# Patient Record
Sex: Female | Born: 1935 | Race: White | Hispanic: No | Smoking: Never smoker
Health system: Southern US, Community
[De-identification: ages and names within clinical notes are randomized; demographics above are authoritative.]

## PROBLEM LIST (undated history)

## (undated) DIAGNOSIS — F32A Depression, unspecified: Secondary | ICD-10-CM

## (undated) DIAGNOSIS — I82409 Acute embolism and thrombosis of unspecified deep veins of unspecified lower extremity: Secondary | ICD-10-CM

## (undated) DIAGNOSIS — I10 Essential (primary) hypertension: Secondary | ICD-10-CM

## (undated) DIAGNOSIS — I519 Heart disease, unspecified: Secondary | ICD-10-CM

## (undated) DIAGNOSIS — E785 Hyperlipidemia, unspecified: Secondary | ICD-10-CM

## (undated) DIAGNOSIS — M1990 Unspecified osteoarthritis, unspecified site: Secondary | ICD-10-CM

## (undated) DIAGNOSIS — G4700 Insomnia, unspecified: Secondary | ICD-10-CM

## (undated) DIAGNOSIS — K219 Gastro-esophageal reflux disease without esophagitis: Secondary | ICD-10-CM

## (undated) DIAGNOSIS — M48061 Spinal stenosis, lumbar region without neurogenic claudication: Secondary | ICD-10-CM

## (undated) DIAGNOSIS — E079 Disorder of thyroid, unspecified: Secondary | ICD-10-CM

## (undated) DIAGNOSIS — K52832 Lymphocytic colitis: Secondary | ICD-10-CM

## (undated) HISTORY — DX: Gastro-esophageal reflux disease without esophagitis: K21.9

## (undated) HISTORY — DX: Acute embolism and thrombosis of unspecified deep veins of unspecified lower extremity: I82.409

## (undated) HISTORY — PX: SPINE SURGERY: SHX786

## (undated) HISTORY — DX: Disorder of thyroid, unspecified: E07.9

## (undated) HISTORY — DX: Insomnia, unspecified: G47.00

## (undated) HISTORY — PX: CHOLECYSTECTOMY: SHX55

## (undated) HISTORY — DX: Hyperlipidemia, unspecified: E78.5

## (undated) HISTORY — DX: Depression, unspecified: F32.A

## (undated) HISTORY — DX: Heart disease, unspecified: I51.9

## (undated) HISTORY — DX: Unspecified osteoarthritis, unspecified site: M19.90

## (undated) HISTORY — DX: Lymphocytic colitis: K52.832

## (undated) HISTORY — DX: Spinal stenosis, lumbar region without neurogenic claudication: M48.061

## (undated) HISTORY — DX: Essential (primary) hypertension: I10

---

## 2013-01-12 ENCOUNTER — Ambulatory Visit (INDEPENDENT_AMBULATORY_CARE_PROVIDER_SITE_OTHER): Payer: Medicare Other | Admitting: Family Medicine

## 2013-01-12 ENCOUNTER — Telehealth: Payer: Self-pay

## 2013-01-12 ENCOUNTER — Encounter: Payer: Self-pay | Admitting: Family Medicine

## 2013-01-12 VITALS — BP 126/62 | HR 57 | Temp 97.2°F | Ht 62.5 in | Wt 186.0 lb

## 2013-01-12 DIAGNOSIS — M1990 Unspecified osteoarthritis, unspecified site: Secondary | ICD-10-CM | POA: Insufficient documentation

## 2013-01-12 DIAGNOSIS — K529 Noninfective gastroenteritis and colitis, unspecified: Secondary | ICD-10-CM

## 2013-01-12 DIAGNOSIS — I10 Essential (primary) hypertension: Secondary | ICD-10-CM

## 2013-01-12 DIAGNOSIS — G4700 Insomnia, unspecified: Secondary | ICD-10-CM | POA: Insufficient documentation

## 2013-01-12 DIAGNOSIS — K5289 Other specified noninfective gastroenteritis and colitis: Secondary | ICD-10-CM

## 2013-01-12 DIAGNOSIS — M129 Arthropathy, unspecified: Secondary | ICD-10-CM

## 2013-01-12 HISTORY — DX: Noninfective gastroenteritis and colitis, unspecified: K52.9

## 2013-01-12 MED ORDER — PROMETHAZINE HCL 25 MG PO TABS: ORAL_TABLET | ORAL | Status: DC

## 2013-01-12 MED ORDER — ATENOLOL 25 MG PO TABS: 25.0000 mg | ORAL_TABLET | Freq: Every day | ORAL | Status: DC

## 2013-01-12 MED ORDER — MELOXICAM 15 MG PO TABS: 15.0000 mg | ORAL_TABLET | Freq: Every day | ORAL | Status: DC

## 2013-01-12 NOTE — Patient Instructions (Signed)

## 2013-01-12 NOTE — Progress Notes (Signed)
  Subjective:    Patient ID: Vedha Tercero, female    DOB: [birth year], 77 y.o.   MRN: [id number]  HPI This 77 y.o. female presents for evaluation of establishment.  She has artritis problems and left  Knee pain.  She needs a handicap sticker.  She has hx of hypertension, insomnia, and she takes [place] and she would like to get off.  She has hx of shingles.  She has had a zostavax.  She Gets occasional neuralgia where she had shingles.  She had a colonoscopy 1 1/2 years ago she  States for colitis.  She has had a mammogram this year and it was negative.   Review of Systems C/o arthralgias.   No chest pain, SOB, HA, dizziness, vision change, N/V, diarrhea, constipation, dysuria, urinary urgency or frequency, or rash.  Objective:   Physical Exam Vital signs noted  Well developed well nourished female.  HEENT - Head atraumatic Normocephalic                Eyes - PERRLA, Conjuctiva - clear Sclera- Clear EOMI                Ears - EAC's Wnl TM's Wnl Gross Hearing WNL                Nose - Nares patent                 Throat - oropharanx wnl Respiratory - Lungs CTA bilateral Cardiac - RRR S1 and S2 without murmur GI - Abdomen soft Nontender and bowel sounds active x 4 Extremities - No edema. Neuro - Grossly intact.       Assessment & Plan:  Insomnia - Plan: promethazine (PHENERGAN) 25 MG tablet, discussed titrating down on ambien and then   Arthritis - Plan: meloxicam (MOBIC) 15 MG tablet  Essential hypertension, benign - Plan: atenolol (TENORMIN) 25 MG tablet  Colitis - She is unsure what type she had but it has resolved.  Follow for now.  Follow up in 3 months

## 2013-01-12 NOTE — Telephone Encounter (Signed)
You e scribed Phenergin for insomnia ??????    Call [facility]

## 2013-01-13 NOTE — Telephone Encounter (Signed)
I rx phenergan for insomnia to help get patient's off of ambien and this is my routine.

## 2013-01-19 DIAGNOSIS — Z029 Encounter for administrative examinations, unspecified: Secondary | ICD-10-CM

## 2013-04-14 ENCOUNTER — Ambulatory Visit (INDEPENDENT_AMBULATORY_CARE_PROVIDER_SITE_OTHER): Payer: BC Managed Care – PPO | Admitting: Family Medicine

## 2013-04-14 ENCOUNTER — Encounter: Payer: Self-pay | Admitting: Family Medicine

## 2013-04-14 VITALS — BP 154/81 | HR 61 | Temp 97.0°F | Ht 62.5 in | Wt 200.8 lb

## 2013-04-14 DIAGNOSIS — G4700 Insomnia, unspecified: Secondary | ICD-10-CM

## 2013-04-14 DIAGNOSIS — M129 Arthropathy, unspecified: Secondary | ICD-10-CM

## 2013-04-14 DIAGNOSIS — Z23 Encounter for immunization: Secondary | ICD-10-CM

## 2013-04-14 DIAGNOSIS — M1990 Unspecified osteoarthritis, unspecified site: Secondary | ICD-10-CM

## 2013-04-14 LAB — POCT CBC
Granulocyte percent: 60.1 %G (ref 37–80)
HCT, POC: 43 % (ref 37.7–47.9)
Hemoglobin: 13.8 g/dL (ref 12.2–16.2)
Lymph, poc: 2.6 (ref 0.6–3.4)
MCH, POC: 29.9 pg (ref 27–31.2)
MCHC: 32.1 g/dL (ref 31.8–35.4)
MCV: 93.3 fL (ref 80–97)
MPV: 8.2 fL (ref 0–99.8)
POC Granulocyte: 4.4 (ref 2–6.9)
POC LYMPH PERCENT: 35 %L (ref 10–50)
Platelet Count, POC: 196 10*3/uL (ref 142–424)
RBC: 4.6 M/uL (ref 4.04–5.48)
RDW, POC: 13.8 %
WBC: 7.4 10*3/uL (ref 4.6–10.2)

## 2013-04-14 MED ORDER — TRAMADOL HCL 50 MG PO TABS: 50.0000 mg | ORAL_TABLET | Freq: Three times a day (TID) | ORAL | Status: DC | PRN

## 2013-04-14 MED ORDER — MELOXICAM 15 MG PO TABS: 15.0000 mg | ORAL_TABLET | Freq: Every day | ORAL | Status: DC

## 2013-04-14 MED ORDER — PROMETHAZINE HCL 25 MG PO TABS: ORAL_TABLET | ORAL | Status: DC

## 2013-04-14 NOTE — Patient Instructions (Signed)
   Shoulder Pain The shoulder is the joint that connects your arms to your body. The bones that form the shoulder joint include the upper arm bone (humerus), the shoulder blade (scapula), and the collarbone (clavicle). The top of the humerus is shaped like a ball and fits into a rather flat socket on the scapula (glenoid cavity). A combination of muscles and strong, fibrous tissues that connect muscles to bones (tendons) support your shoulder joint and hold the ball in the socket. Small, fluid-filled sacs (bursae) are located in different areas of the joint. They act as cushions between the bones and the overlying soft tissues and help reduce friction between the gliding tendons and the bone as you move your arm. Your shoulder joint allows a wide range of motion in your arm. This range of motion allows you to do things like scratch your back or throw a ball. However, this range of motion also makes your shoulder more prone to pain from overuse and injury. Causes of shoulder pain can originate from both injury and overuse and usually can be grouped in the following four categories:  Redness, swelling, and pain (inflammation) of the tendon (tendinitis) or the bursae (bursitis).  Instability, such as a dislocation of the joint.  Inflammation of the joint (arthritis).  Broken bone (fracture). HOME CARE INSTRUCTIONS   Apply ice to the sore area.  Put ice in a plastic bag.  Place a towel between your skin and the bag.  Leave the ice on for 15-20 minutes, 03-04 times per day for the first 2 days.  Stop using cold packs if they do not help with the pain.  If you have a shoulder sling or immobilizer, wear it as long as your caregiver instructs. Only remove it to shower or bathe. Move your arm as little as possible, but keep your hand moving to prevent swelling.  Squeeze a soft ball or foam pad as much as possible to help prevent swelling.  Only take over-the-counter or prescription medicines for  pain, discomfort, or fever as directed by your caregiver. SEEK MEDICAL CARE IF:   Your shoulder pain increases, or new pain develops in your arm, hand, or fingers.  Your hand or fingers become cold and numb.  Your pain is not relieved with medicines. SEEK IMMEDIATE MEDICAL CARE IF:   Your arm, hand, or fingers are numb or tingling.  Your arm, hand, or fingers are significantly swollen or turn white or blue. MAKE SURE YOU:   Understand these instructions.  Will watch your condition.  Will get help right away if you are not doing well or get worse. Document Released: 02/18/2005 Document Revised: 02/03/2012 Document Reviewed: 04/25/2011 [employer] Patient Information 2014 [employer].  

## 2013-04-14 NOTE — Progress Notes (Signed)
  Subjective:    Patient ID: Kelsey Mills, female    DOB: 1935/09/13, 77 y.o.   MRN: [id number]  HPI This 77 y.o. female presents for evaluation of follow up appointment.  She has  Hx of arthritis and she is having right shoulder arthralgias.   Review of Systems C/o right shoulder injection No chest pain, SOB, HA, dizziness, vision change, N/V, diarrhea, constipation, dysuria, urinary urgency or frequency or rash.     Objective:   Physical Exam  Vital signs noted  Well developed well nourished female.  HEENT - Head atraumatic Normocephalic                Eyes - PERRLA, Conjuctiva - clear Sclera- Clear EOMI                Ears - EAC's Wnl TM's Wnl Gross Hearing WNL                Nose - Nares patent                 Throat - oropharanx wnl Respiratory - Lungs CTA bilateral Cardiac - RRR S1 and S2 without murmur GI - Abdomen soft Nontender and bowel sounds active x 4. MS - TTP right shoulder with decreased ROM.  Procedure - Posterior right shoulder injection with one cc lidocaine and 1 cc of kenalog.      Assessment & Plan:  Need for prophylactic vaccination and inoculation against influenza  Arthritis - Plan: meloxicam (MOBIC) 15 MG tablet, POCT CBC, CMP14+EGFR, Thyroid Panel With TSH, traMADol (ULTRAM) 50 MG tablet.  Right posterior shoulder injection.  Insomnia - Plan: promethazine (PHENERGAN) 25 MG tablet, POCT CBC, CMP14+EGFR, Thyroid Panel With TSH.  Anselm Pancoast Rigby Leonhardt FNP  Deatra Canter FNP

## 2013-04-15 LAB — CMP14+EGFR
ALT: 16 IU/L (ref 0–32)
AST: 19 IU/L (ref 0–40)
Albumin/Globulin Ratio: 1.7 (ref 1.1–2.5)
Albumin: 4 g/dL (ref 3.5–4.8)
Alkaline Phosphatase: 94 IU/L (ref 39–117)
BUN/Creatinine Ratio: 21 (ref 11–26)
BUN: 21 mg/dL (ref 8–27)
CO2: 27 mmol/L (ref 18–29)
Calcium: 9.2 mg/dL (ref 8.6–10.2)
Chloride: 101 mmol/L (ref 97–108)
Creatinine, Ser: 1.01 mg/dL — ABNORMAL HIGH (ref 0.57–1.00)
GFR calc Af Amer: 63 mL/min/{1.73_m2} (ref >59)
GFR calc non Af Amer: 55 mL/min/{1.73_m2} — ABNORMAL LOW (ref >59)
Globulin, Total: 2.4 g/dL (ref 1.5–4.5)
Glucose: 101 mg/dL — ABNORMAL HIGH (ref 65–99)
Potassium: 5.2 mmol/L (ref 3.5–5.2)
Sodium: 143 mmol/L (ref 134–144)
Total Bilirubin: 0.3 mg/dL (ref 0.0–1.2)
Total Protein: 6.4 g/dL (ref 6.0–8.5)

## 2013-04-15 LAB — THYROID PANEL WITH TSH
Free Thyroxine Index: 2.2 (ref 1.2–4.9)
T3 Uptake Ratio: 28 % (ref 24–39)
T4, Total: 8 ug/dL (ref 4.5–12.0)
TSH: 0.709 u[IU]/mL (ref 0.450–4.500)

## 2013-08-26 ENCOUNTER — Other Ambulatory Visit: Payer: Self-pay | Admitting: Family Medicine

## 2013-09-26 ENCOUNTER — Other Ambulatory Visit: Payer: Self-pay | Admitting: Family Medicine

## 2013-09-27 NOTE — Telephone Encounter (Signed)
Last office visit was 01-12-13. Please advise on refill 

## 2013-10-04 ENCOUNTER — Other Ambulatory Visit: Payer: Self-pay | Admitting: Family Medicine

## 2013-12-19 ENCOUNTER — Ambulatory Visit (INDEPENDENT_AMBULATORY_CARE_PROVIDER_SITE_OTHER): Payer: BC Managed Care – PPO | Admitting: Family Medicine

## 2013-12-19 ENCOUNTER — Encounter: Payer: Self-pay | Admitting: Family Medicine

## 2013-12-19 VITALS — BP 127/64 | HR 61 | Temp 97.2°F | Ht 62.5 in | Wt 212.6 lb

## 2013-12-19 DIAGNOSIS — M1990 Unspecified osteoarthritis, unspecified site: Secondary | ICD-10-CM

## 2013-12-19 DIAGNOSIS — M129 Arthropathy, unspecified: Secondary | ICD-10-CM

## 2013-12-19 DIAGNOSIS — M79605 Pain in left leg: Secondary | ICD-10-CM

## 2013-12-19 DIAGNOSIS — M79609 Pain in unspecified limb: Secondary | ICD-10-CM

## 2013-12-19 DIAGNOSIS — E0500 Thyrotoxicosis with diffuse goiter without thyrotoxic crisis or storm: Secondary | ICD-10-CM

## 2013-12-19 MED ORDER — TRAMADOL HCL 50 MG PO TABS: 50.0000 mg | ORAL_TABLET | Freq: Three times a day (TID) | ORAL | Status: DC | PRN

## 2013-12-19 NOTE — Progress Notes (Signed)
   Subjective:    Patient ID: Kelsey Mills, female    DOB: 10/13/1935, 78 y.o.   MRN: [id number]  HPI Patient is here today with c/o severe pain in her left leg and thigh for a couple weeks.  She takes mobic and it is not helping.  She states she is swelling in her left lower extremity more and this is worse at the end of the day.  She states [place] weeks ago and had to drive a long distance.  She states she has had the pain and swelling in the left lower extremity since [place]. She has hx of Graves disease and she was seeing an endocrinologist and would like to be referred to Endocrinology because she ahs gained 40 pounds and she is attributing this to her thyroid or Graves disease.   Review of Systems    No chest pain, SOB, HA, dizziness, vision change, N/V, diarrhea, constipation, dysuria, urinary urgency or frequency, myalgias, arthralgias or rash.  Objective:   Physical Exam Vital signs noted  Well developed well nourished female.  HEENT - Head atraumatic Normocephalic                Eyes - PERRLA, Conjuctiva - clear Sclera- Clear EOMI                Ears - EAC's Wnl TM's Wnl Gross Hearing WN                 Throat - oropharanx Lwnl Respiratory - Lungs CTA bilateral Cardiac - RRR S1 and S2 without murmur GI - Abdomen soft Nontender and bowel sounds active x 4 Extremities - No edema. Positive Homan's left calf Neuro - Grossly intact.       Assessment & Plan:  Arthritis - Plan: traMADol (ULTRAM) 50 MG tablet  Graves disease - Plan: Ambulatory referral to Endocrinology  Left leg pain - Plan: US Venous Img Lower Unilateral Left STAT.  Deatra CanterWilliam J [facility] FNP

## 2014-01-16 ENCOUNTER — Encounter: Payer: Self-pay | Admitting: Endocrinology

## 2014-01-16 ENCOUNTER — Ambulatory Visit (INDEPENDENT_AMBULATORY_CARE_PROVIDER_SITE_OTHER): Payer: MEDICARE | Admitting: Endocrinology

## 2014-01-16 VITALS — BP 126/66 | HR 65 | Temp 97.4°F | Ht 62.5 in | Wt 210.0 lb

## 2014-01-16 DIAGNOSIS — R635 Abnormal weight gain: Secondary | ICD-10-CM | POA: Insufficient documentation

## 2014-01-16 HISTORY — DX: Abnormal weight gain: R63.5

## 2014-01-16 LAB — SEDIMENTATION RATE: Sed Rate: 18 mm/hr (ref 0–22)

## 2014-01-16 LAB — T3, FREE: T3, Free: 2.8 pg/mL (ref 2.3–4.2)

## 2014-01-16 LAB — TSH: TSH: 0.36 u[IU]/mL (ref 0.35–4.50)

## 2014-01-16 LAB — T4, FREE: Free T4: 1.21 ng/dL (ref 0.60–1.60)

## 2014-01-16 NOTE — Patient Instructions (Signed)
blood tests are being requested for you today.  We'll contact you with results. I would be happy to see you back here whenever you want.   

## 2014-01-16 NOTE — Progress Notes (Signed)
Subjective:    Patient ID: Kelsey Mills, female    DOB: [birth year], 78 y.o.   MRN: [id number]  HPI Pt states 1 year of moderate hair loss throughout the head, and assoc weight gain.  She says she was rx'ed at the [facility] a few years ago, for hyperthyroidism, due to grave's dz.  However, med was stopped due to improvement.   Past Medical History  Diagnosis Date  . Arthritis     Past Surgical History  Procedure Laterality Date  . Cholecystectomy    . Spine surgery      History   Social History  . Marital Status: Widowed    Spouse Name: N/A    Number of Children: N/A  . Years of Education: N/A   Occupational History  . Not on file.   Social History Main Topics  . Smoking status: Never Smoker   . Smokeless tobacco: Not on file  . Alcohol Use: No  . Drug Use: No  . Sexual Activity: Not on file   Other Topics Concern  . Not on file   Social History Narrative  . No narrative on file    Current Outpatient Prescriptions on File Prior to Visit  Medication Sig Dispense Refill  . atenolol (TENORMIN) 25 MG tablet Take 1 tablet (25 mg total) by mouth daily.  30 tablet  3  . meloxicam (MOBIC) 15 MG tablet TAKE 1 TABLET (15 MG TOTAL) BY MOUTH DAILY.  30 tablet  1  . promethazine (PHENERGAN) 25 MG tablet TAKE 1 TABLET BY MOUTH AT BEDTIME AS INSOMNIA  30 tablet  3  . traMADol (ULTRAM) 50 MG tablet Take 1 tablet (50 mg total) by mouth every 8 (eight) hours as needed.  30 tablet  0   No current facility-administered medications on file prior to visit.    No Known Allergies  No family history on file.  BP 126/66  Pulse 65  Temp(Src) 97.4 F (36.3 C) (Oral)  Ht 5' 2.5" (1.588 m)  Wt 210 lb (95.255 kg)  BMI 37.77 kg/m2  SpO2 94%  Review of Systems She has fatigue, depression, arthralgias, rhinorrhea, muscle cramps, cold intolerance, easy bruising, and dry skin.  denies sob, fever, memory loss, constipation, numbness, blurry vision, and syncope.       Objective:   Physical Exam VS: see vs page.   GEN: no distress. HEAD: head: no deformity.  Normal hair distribution.   eyes: no periorbital swelling, no proptosis external nose and ears are normal mouth: no lesion seen. NECK: supple, thyroid is not enlarged.   CHEST WALL: no deformity.  LUNGS:  Clear to auscultation.  CV: reg rate and rhythm, no murmur.  ABD: abdomen is soft, nontender.  no hepatosplenomegaly.  not distended.  no hernia MUSCULOSKELETAL: muscle bulk and strength are grossly normal.  no obvious joint swelling.  gait is normal and steady EXTEMITIES: no deformity.  no ulcer on the feet.  feet are of normal color and temp.  no edema PULSES: dorsalis pedis intact bilat.  no carotid bruit NEURO:  cn 2-12 grossly intact.   readily moves all 4's.  sensation is intact to touch on the feet SKIN:  Normal texture and temperature.  No rash or suspicious lesion is visible.  Not diaphoretic.  NODES:  None palpable at the neck.   PSYCH: alert, well-oriented.  Does not appear anxious nor depressed.  Lab Results  Component Value Date   TSH 0.36 01/16/2014   T4TOTAL 8.0 04/14/2013  Anti-TPO antibody: pos  i have reviewed the following outside records: Office notes    Assessment & Plan:  Thyroid autoimmunity: persistent.  This was likely the cause for her need for rx in the past.  Hair loss, new to me: could be autoimmune, but not thyroid-related. weight gain: not thyroid-related.    Patient is advised the following: Patient Instructions  blood tests are being requested for you today.  We'll contact you with results. I would be happy to see you back here whenever you want.     pt is advised to have TFT done each year.

## 2014-01-17 LAB — THYROID PEROXIDASE ANTIBODY: THYROID PEROXIDASE ANTIBODY: 19 [IU]/mL — AB (ref <9)

## 2014-02-05 ENCOUNTER — Other Ambulatory Visit: Payer: Self-pay | Admitting: Family Medicine

## 2014-02-06 NOTE — Telephone Encounter (Signed)
Last seen and filled 12/19/13

## 2014-02-08 ENCOUNTER — Telehealth: Payer: Self-pay | Admitting: Family Medicine

## 2014-02-08 NOTE — Telephone Encounter (Signed)
PATIENT WAS CONFUSED. RX IS UP FRONT TO PICK UP

## 2014-03-09 ENCOUNTER — Encounter: Payer: Self-pay | Admitting: Family Medicine

## 2014-03-09 ENCOUNTER — Ambulatory Visit (INDEPENDENT_AMBULATORY_CARE_PROVIDER_SITE_OTHER): Payer: BC Managed Care – PPO

## 2014-03-09 ENCOUNTER — Ambulatory Visit (INDEPENDENT_AMBULATORY_CARE_PROVIDER_SITE_OTHER): Payer: BC Managed Care – PPO | Admitting: Family Medicine

## 2014-03-09 VITALS — BP 138/72 | HR 58 | Temp 96.7°F | Ht 62.5 in | Wt 201.4 lb

## 2014-03-09 DIAGNOSIS — M25562 Pain in left knee: Secondary | ICD-10-CM

## 2014-03-09 DIAGNOSIS — M25511 Pain in right shoulder: Secondary | ICD-10-CM

## 2014-03-09 MED ORDER — TRAMADOL HCL 50 MG PO TABS: 50.0000 mg | ORAL_TABLET | Freq: Two times a day (BID) | ORAL | Status: DC

## 2014-03-09 MED ORDER — PROMETHAZINE HCL 25 MG PO TABS: 25.0000 mg | ORAL_TABLET | Freq: Three times a day (TID) | ORAL | Status: DC | PRN

## 2014-03-09 MED ORDER — MELOXICAM 15 MG PO TABS: 15.0000 mg | ORAL_TABLET | Freq: Every day | ORAL | Status: DC

## 2014-03-09 NOTE — Progress Notes (Signed)
   Subjective:    Patient ID: Kelsey Mills, female    DOB: Apr 23, 1936, 78 y.o.   MRN: [id number]  HPI This 78 y.o. female presents for evaluation of right shoulder pain and bilateral knee pain..   Review of Systems No chest pain, SOB, HA, dizziness, vision change, N/V, diarrhea, constipation, dysuria, urinary urgency or frequency, myalgias, arthralgias or rash.     Objective:   Physical Exam  Vital signs noted  Well developed well nourished female.  HEENT - Head atraumatic Normocephalic                Eyes - PERRLA, Conjuctiva - clear Sclera- Clear EOMI                Ears - EAC's Wnl TM's Wnl Gross Hearing WNL                Nose - Nares patent                 Throat - oropharanx wnl Respiratory - Lungs CTA bilateral Cardiac - RRR S1 and S2 without murmur GI - Abdomen soft Nontender and bowel sounds active x 4 Extremities - No edema. Neuro - Grossly intact. MS - TTP bilateral knees and right shoulder.  Decreased ROM right shoulder with abduction.    xray right shoulder - arthritis Xray of bilateral knees - decreased joint space Prelimnary reading by Chrissie NoaWilliam Shakura Cowing,FNP Assessment & Plan:  Left knee pain - Plan: DG Knee 1-2 Views Left, traMADol (ULTRAM) 50 MG tablet, meloxicam (MOBIC) 15 MG tablet  Right shoulder pain - Plan: DG Shoulder Right, traMADol (ULTRAM) 50 MG tablet, meloxicam (MOBIC) 15 MG tablet  OA - Mobic 15mg  one po qd  Deatra CanterWilliam J Rolande Moe FNP

## 2014-03-10 ENCOUNTER — Ambulatory Visit: Payer: BC Managed Care – PPO

## 2014-03-15 ENCOUNTER — Ambulatory Visit (INDEPENDENT_AMBULATORY_CARE_PROVIDER_SITE_OTHER): Payer: BC Managed Care – PPO

## 2014-03-15 ENCOUNTER — Other Ambulatory Visit: Payer: Self-pay | Admitting: *Deleted

## 2014-03-15 DIAGNOSIS — Z23 Encounter for immunization: Secondary | ICD-10-CM

## 2014-07-12 ENCOUNTER — Other Ambulatory Visit: Payer: Self-pay | Admitting: *Deleted

## 2014-07-12 DIAGNOSIS — M25511 Pain in right shoulder: Secondary | ICD-10-CM

## 2014-07-12 DIAGNOSIS — M25562 Pain in left knee: Secondary | ICD-10-CM

## 2014-07-12 MED ORDER — MELOXICAM 15 MG PO TABS: 15.0000 mg | ORAL_TABLET | Freq: Every day | ORAL | Status: DC

## 2014-07-12 NOTE — Telephone Encounter (Signed)
This medication is okay 1 and patient will need to make an appointment to continue to get this refilled in the future.

## 2014-07-12 NOTE — Telephone Encounter (Signed)
[facility] patient 

## 2015-02-13 ENCOUNTER — Telehealth: Payer: Self-pay | Admitting: Family Medicine

## 2016-03-11 ENCOUNTER — Telehealth: Payer: Self-pay | Admitting: Family

## 2016-03-11 NOTE — Telephone Encounter (Signed)
Aware.    Prevnar 13 on 03-15-14.

## 2019-11-09 ENCOUNTER — Other Ambulatory Visit: Payer: Self-pay

## 2019-11-09 ENCOUNTER — Ambulatory Visit (INDEPENDENT_AMBULATORY_CARE_PROVIDER_SITE_OTHER): Payer: Medicare HMO | Admitting: Nurse Practitioner

## 2019-11-09 ENCOUNTER — Encounter: Payer: Self-pay | Admitting: Nurse Practitioner

## 2019-11-09 VITALS — BP 105/67 | HR 87 | Temp 97.8°F | Resp 20 | Ht 62.5 in | Wt 186.0 lb

## 2019-11-09 DIAGNOSIS — F418 Other specified anxiety disorders: Secondary | ICD-10-CM

## 2019-11-09 DIAGNOSIS — R601 Generalized edema: Secondary | ICD-10-CM

## 2019-11-09 HISTORY — DX: Generalized edema: R60.1

## 2019-11-09 HISTORY — DX: Other specified anxiety disorders: F41.8

## 2019-11-09 MED ORDER — ESCITALOPRAM OXALATE 5 MG PO TABS: 5.0000 mg | ORAL_TABLET | Freq: Every day | ORAL | 0 refills | Status: DC

## 2019-11-09 NOTE — Patient Instructions (Addendum)
Depression with anxiety Patient is a 84 year old female complains of depression.  She complains of little interest or lack of pleasure in doing things, feeling down and hopeless.  Patient reports feeling tired and having little energy.  Onset was approximately few weeks ago.  She denies current suicidal and homicidal plans or intent.  Family history is not significant for depression.  She has not been treated in the past for depression or anxiety.  Started patient on 5 mg Lexapro daily.  Patient will follow up in 4 weeks.  Provided education to patient with printed handout given. Medication sent to pharmacy.  Generalized edema Patient presents for bilateral lower extremity edema pitting +3.  Patient is new to practice and establishing care.  Patient reports that prior to coming to clinic PCP addressed edema, and congestive heart failure.  Patient is working on sending complete medical record.  Patient is currently on 40 mg Lasix twice daily.  Patient is reporting no therapeutic effect from medication.  Feet are progressively worse.  Mild discoloration bilateral lower extremity.  Tenderness and painful ankles.  Patient reports she is unable to wear shoes, she is unable to put on compression socks, she tries to elevate her feet above her heart but nothing has helped to alleviate symptoms. Referral to cardiology completed. Provided education to patient and family with printed handout given.   Edema  Edema is when you have too much fluid in your body or under your skin. Edema may make your legs, feet, and ankles swell up. Swelling is also common in looser tissues, like around your eyes. This is a common condition. It gets more common as you get older. There are many possible causes of edema. Eating too much salt (sodium) and being on your feet or sitting for a long time can cause edema in your legs, feet, and ankles. Hot weather may make edema worse. Edema is usually painless. Your skin may look swollen  or shiny. Follow these instructions at home:  Keep the swollen body part raised (elevated) above the level of your heart when you are sitting or lying down.  Do not sit still or stand for a long time.  Do not wear tight clothes. Do not wear garters on your upper legs.  Exercise your legs. This can help the swelling go down.  Wear elastic bandages or support stockings as told by your doctor.  Eat a low-salt (low-sodium) diet to reduce fluid as told by your doctor.  Depending on the cause of your swelling, you may need to limit how much fluid you drink (fluid restriction).  Take over-the-counter and prescription medicines only as told by your doctor. Contact a doctor if:  Treatment is not working.  You have heart, liver, or kidney disease and have symptoms of edema.  You have sudden and unexplained weight gain. Get help right away if:  You have shortness of breath or chest pain.  You cannot breathe when you lie down.  You have pain, redness, or warmth in the swollen areas.  You have heart, liver, or kidney disease and get edema all of a sudden.  You have a fever and your symptoms get worse all of a sudden. Summary  Edema is when you have too much fluid in your body or under your skin.  Edema may make your legs, feet, and ankles swell up. Swelling is also common in looser tissues, like around your eyes.  Raise (elevate) the swollen body part above the level of your heart  when you are sitting or lying down.  Follow your doctor's instructions about diet and how much fluid you can drink (fluid restriction). This information is not intended to replace advice given to you by your health care provider. Make sure you discuss any questions you have with your health care provider. Document Revised: 05/14/2017 Document Reviewed: 05/29/2016 Elsevier Patient Education  [place]. Generalized Anxiety Disorder, Adult Generalized anxiety disorder (GAD) is a mental health  disorder. People with this condition constantly worry about everyday events. Unlike normal anxiety, worry related to GAD is not triggered by a specific event. These worries also do not fade or get better with time. GAD interferes with life functions, including relationships, work, and school. GAD can vary from mild to severe. People with severe GAD can have intense waves of anxiety with physical symptoms (panic attacks). What are the causes? The exact cause of GAD is not known. What increases the risk? This condition is more likely to develop in:  Women.  People who have a family history of anxiety disorders.  People who are very shy.  People who experience very stressful life events, such as the death of a loved one.  People who have a very stressful family environment. What are the signs or symptoms? People with GAD often worry excessively about many things in their lives, such as their health and family. They may also be overly concerned about:  Doing well at work.  Being on time.  Natural disasters.  Friendships. Physical symptoms of GAD include:  Fatigue.  Muscle tension or having muscle twitches.  Trembling or feeling shaky.  Being easily startled.  Feeling like your heart is pounding or racing.  Feeling out of breath or like you cannot take a deep breath.  Having trouble falling asleep or staying asleep.  Sweating.  Nausea, diarrhea, or irritable bowel syndrome (IBS).  Headaches.  Trouble concentrating or remembering facts.  Restlessness.  Irritability. How is this diagnosed? Your health care provider can diagnose GAD based on your symptoms and medical history. You will also have a physical exam. The health care provider will ask specific questions about your symptoms, including how severe they are, when they started, and if they come and go. Your health care provider may ask you about your use of alcohol or drugs, including prescription medicines. Your  health care provider may refer you to a mental health specialist for further evaluation. Your health care provider will do a thorough examination and may perform additional tests to rule out other possible causes of your symptoms. To be diagnosed with GAD, a person must have anxiety that:  Is out of his or her control.  Affects several different aspects of his or her life, such as work and relationships.  Causes distress that makes him or her unable to take part in normal activities.  Includes at least three physical symptoms of GAD, such as restlessness, fatigue, trouble concentrating, irritability, muscle tension, or sleep problems. Before your health care provider can confirm a diagnosis of GAD, these symptoms must be present more days than they are not, and they must last for six months or longer. How is this treated? The following therapies are usually used to treat GAD:  Medicine. Antidepressant medicine is usually prescribed for long-term daily control. Antianxiety medicines may be added in severe cases, especially when panic attacks occur.  Talk therapy (psychotherapy). Certain types of talk therapy can be helpful in treating GAD by providing support, education, and guidance. Options include: ?  Cognitive behavioral therapy (CBT). People learn coping skills and techniques to ease their anxiety. They learn to identify unrealistic or negative thoughts and behaviors and to replace them with positive ones. ? Acceptance and commitment therapy (ACT). This treatment teaches people how to be mindful as a way to cope with unwanted thoughts and feelings. ? Biofeedback. This process trains you to manage your body's response (physiological response) through breathing techniques and relaxation methods. You will work with a therapist while machines are used to monitor your physical symptoms.  Stress management techniques. These include yoga, meditation, and exercise. A mental health specialist can  help determine which treatment is best for you. Some people see improvement with one type of therapy. However, other people require a combination of therapies. Follow these instructions at home:  Take over-the-counter and prescription medicines only as told by your health care provider.  Try to maintain a normal routine.  Try to anticipate stressful situations and allow extra time to manage them.  Practice any stress management or self-calming techniques as taught by your health care provider.  Do not punish yourself for setbacks or for not making progress.  Try to recognize your accomplishments, even if they are small.  Keep all follow-up visits as told by your health care provider. This is important. Contact a health care provider if:  Your symptoms do not get better.  Your symptoms get worse.  You have signs of depression, such as: ? A persistently sad, cranky, or irritable mood. ? Loss of enjoyment in activities that used to bring you joy. ? Change in weight or eating. ? Changes in sleeping habits. ? Avoiding friends or family members. ? Loss of energy for normal tasks. ? Feelings of guilt or worthlessness. Get help right away if:  You have serious thoughts about hurting yourself or others. If you ever feel like you may hurt yourself or others, or have thoughts about taking your own life, get help right away. You can go to your nearest emergency department or call:  Your local emergency services (911 in the [place]  A suicide crisis helpline, such as the National Suicide Prevention Lifeline at 8167390667. This is open 24 hours a day. Summary  Generalized anxiety disorder (GAD) is a mental health disorder that involves worry that is not triggered by a specific event.  People with GAD often worry excessively about many things in their lives, such as their health and family.  GAD may cause physical symptoms such as restlessness, trouble concentrating, sleep problems,  frequent sweating, nausea, diarrhea, headaches, and trembling or muscle twitching.  A mental health specialist can help determine which treatment is best for you. Some people see improvement with one type of therapy. However, other people require a combination of therapies. This information is not intended to replace advice given to you by your health care provider. Make sure you discuss any questions you have with your health care provider. Document Revised: 04/23/2017 Document Reviewed: 03/31/2016 Elsevier Patient Education  2020 [employer] Living With Depression Everyone experiences occasional disappointment, sadness, and loss in their lives. When you are feeling down, blue, or sad for at least 2 weeks in a row, it may mean that you have depression. Depression can affect your thoughts and feelings, relationships, daily activities, and physical health. It is caused by changes in the way your brain functions. If you receive a diagnosis of depression, your health care provider will tell you which type of depression you have and what treatment options are available  to you. If you are living with depression, there are ways to help you recover from it and also ways to prevent it from coming back. How to cope with lifestyle changes Coping with stress     Stress is your body's reaction to life changes and events, both good and bad. Stressful situations may include:  Getting married.  The death of a spouse.  Losing a job.  Retiring.  Having a baby. Stress can last just a few hours or it can be ongoing. Stress can play a major role in depression, so it is important to learn both how to cope with stress and how to think about it differently. Talk with your health care provider or a counselor if you would like to learn more about stress reduction. He or she may suggest some stress reduction techniques, such as:  Music therapy. This can include creating music or listening to music. Choose music  that you enjoy and that inspires you.  Mindfulness-based meditation. This kind of meditation can be done while sitting or walking. It involves being aware of your normal breaths, rather than trying to control your breathing.  Centering prayer. This is a kind of meditation that involves focusing on a spiritual word or phrase. Choose a word, phrase, or sacred image that is meaningful to you and that brings you peace.  Deep breathing. To do this, expand your stomach and inhale slowly through your nose. Hold your breath for 3-5 seconds, then exhale slowly, allowing your stomach muscles to relax.  Muscle relaxation. This involves intentionally tensing muscles then relaxing them. Choose a stress reduction technique that fits your lifestyle and personality. Stress reduction techniques take time and practice to develop. Set aside 5-15 minutes a day to do them. Therapists can offer training in these techniques. The training may be covered by some insurance plans. Other things you can do to manage stress include:  Keeping a stress diary. This can help you learn what triggers your stress and ways to control your response.  Understanding what your limits are and saying no to requests or events that lead to a schedule that is too full.  Thinking about how you respond to certain situations. You may not be able to control everything, but you can control how you react.  Adding humor to your life by watching funny films or TV shows.  Making time for activities that help you relax and not feeling guilty about spending your time this way.  Medicines Your health care provider may suggest certain medicines if he or she feels that they will help improve your condition. Avoid using alcohol and other substances that may prevent your medicines from working properly (may interact). It is also important to:  Talk with your pharmacist or health care provider about all the medicines that you take, their possible side  effects, and what medicines are safe to take together.  Make it your goal to take part in all treatment decisions (shared decision-making). This includes giving input on the side effects of medicines. It is best if shared decision-making with your health care provider is part of your total treatment plan. If your health care provider prescribes a medicine, you may not notice the full benefits of it for 4-8 weeks. Most people who are treated for depression need to be on medicine for at least 6-12 months after they feel better. If you are taking medicines as part of your treatment, do not stop taking medicines without first talking to your health  care provider. You may need to have the medicine slowly decreased (tapered) over time to decrease the risk of harmful side effects. Relationships Your health care provider may suggest family therapy along with individual therapy and drug therapy. While there may not be family problems that are causing you to feel depressed, it is still important to make sure your family learns as much as they can about your mental health. Having your family's support can help make your treatment successful. How to recognize changes in your condition Everyone has a different response to treatment for depression. Recovery from major depression happens when you have not had signs of major depression for two months. This may mean that you will start to:  Have more interest in doing activities.  Feel less hopeless than you did 2 months ago.  Have more energy.  Overeat less often, or have better or improving appetite.  Have better concentration. Your health care provider will work with you to decide the next steps in your recovery. It is also important to recognize when your condition is getting worse. Watch for these signs:  Having fatigue or low energy.  Eating too much or too little.  Sleeping too much or too little.  Feeling restless, agitated, or hopeless.  Having  trouble concentrating or making decisions.  Having unexplained physical complaints.  Feeling irritable, angry, or aggressive. Get help as soon as you or your family members notice these symptoms coming back. How to get support and help from others How to talk with friends and family members about your condition  Talking to friends and family members about your condition can provide you with one way to get support and guidance. Reach out to trusted friends or family members, explain your symptoms to them, and let them know that you are working with a health care provider to treat your depression. Financial resources Not all insurance plans cover mental health care, so it is important to check with your insurance carrier. If paying for co-pays or counseling services is a problem, search for a local or county mental health care center. They may be able to offer public mental health care services at low or no cost when you are not able to see a private health care provider. If you are taking medicine for depression, you may be able to get the generic form, which may be less expensive. Some makers of prescription medicines also offer help to patients who cannot afford the medicines they need. Follow these instructions at home:   Get the right amount and quality of sleep.  Cut down on using caffeine, tobacco, alcohol, and other potentially harmful substances.  Try to exercise, such as walking or lifting small weights.  Take over-the-counter and prescription medicines only as told by your health care provider.  Eat a healthy diet that includes plenty of vegetables, fruits, whole grains, low-fat dairy products, and lean protein. Do not eat a lot of foods that are high in solid fats, added sugars, or salt.  Keep all follow-up visits as told by your health care provider. This is important. Contact a health care provider if:  You stop taking your antidepressant medicines, and you have any of these  symptoms: ? Nausea. ? Headache. ? Feeling lightheaded. ? Chills and body aches. ? Not being able to sleep (insomnia).  You or your friends and family think your depression is getting worse. Get help right away if:  You have thoughts of hurting yourself or others. If you ever feel  like you may hurt yourself or others, or have thoughts about taking your own life, get help right away. You can go to your nearest emergency department or call:  Your local emergency services (911 in the [place]  A suicide crisis helpline, such as the National Suicide Prevention Lifeline at 570-777-73971-(214)732-2407. This is open 24-hours a day. Summary  If you are living with depression, there are ways to help you recover from it and also ways to prevent it from coming back.  Work with your health care team to create a management plan that includes counseling, stress management techniques, and healthy lifestyle habits. This information is not intended to replace advice given to you by your health care provider. Make sure you discuss any questions you have with your health care provider. Document Revised: 09/02/2018 Document Reviewed: 04/13/2016 Elsevier Patient Education  2020 [employer]

## 2019-11-09 NOTE — Progress Notes (Signed)
New Patient Office Visit  Subjective:  Patient ID: Kelsey Mills, female    DOB: [birth year]  Age: 84 y.o. MRN: [id number]  CC:  Chief Complaint  Patient presents with  . Establish Care    HPI Kelsey Mills presents for bilateral lower extremity edema pitting +3.  Patient is new to practice and establishing care.  Patient reports prior to coming to clinic PCP addressed edema and congestive heart failure.  Patient is working on sending complete medical record.  Patient is currently on 40 mg Lasix twice daily.  Patient is reporting no therapeutic effects from medication.  Feet are progressively worse.  Mild discoloration bilateral lower extremities, tenderness and painful ankles.  Patient reports she is unable to wear shoes, she is unable to put on compression socks, she tries to elevate her feet above her heart but nothing has helped to alleviate symptoms.   Depression and anxiety: Patient complains of depression. She complains of little interest or pleasure in doing things, feeling down and hopeless.  Patient reports feeling tired and having no energy.. Onset was approximately few weeks ago. She denies current suicidal and homicidal plan or intent.   Family history not significant for for depression.  She has not been treated in the past for depression and anxiety.   Past Medical History:  Diagnosis Date  . Arthritis     Past Surgical History:  Procedure Laterality Date  . CHOLECYSTECTOMY    . SPINE SURGERY        Social History   Socioeconomic History  . Marital status: Widowed    Spouse name: Not on file  . Number of children: Not on file  . Years of education: Not on file  . Highest education level: Not on file  Occupational History  . Not on file  Tobacco Use  . Smoking status: Never Smoker  Substance and Sexual Activity  . Alcohol use: No  . Drug use: No  . Sexual activity: Not on file  Other Topics Concern  . Not on file  Social History Narrative  .  Not on file   Social Determinants of Health   Financial Resource Strain:   . Difficulty of Paying Living Expenses:   Food Insecurity:   . Worried About Charity fundraiser in the Last Year:   . Arboriculturist in the Last Year:   Transportation Needs:   . Film/video editor (Medical):   Marland Kitchen Lack of Transportation (Non-Medical):   Physical Activity:   . Days of Exercise per Week:   . Minutes of Exercise per Session:   Stress:   . Feeling of Stress :   Social Connections:   . Frequency of Communication with Friends and Family:   . Frequency of Social Gatherings with Friends and Family:   . Attends Religious Services:   . Active Member of Clubs or Organizations:   . Attends Archivist Meetings:   Marland Kitchen Marital Status:   Intimate Partner Violence:   . Fear of Current or Ex-Partner:   . Emotionally Abused:   Marland Kitchen Physically Abused:   . Sexually Abused:      Office Visit from 11/09/2019 in [facility]  PHQ-9 Total Score 10     GAD 7 : Generalized Anxiety Score 11/09/2019  Nervous, Anxious, on Edge 2  Control/stop worrying 3  Worry too much - different things 3  Trouble relaxing 1  Restless 0  Easily annoyed or irritable 0  Afraid - awful  might happen 0  Total GAD 7 Score 9  Anxiety Difficulty Not difficult at all    ROS Review of Systems  Constitutional: Negative.   HENT: Negative.   Eyes: Negative.   Respiratory: Negative.  Negative for chest tightness and shortness of breath.   Cardiovascular: Positive for leg swelling.  Gastrointestinal: Negative for abdominal pain.  Genitourinary: Negative.   Musculoskeletal: Positive for joint swelling.  Skin: Positive for color change.    Objective:   Today's Vitals: BP 105/67   Pulse 87   Temp 97.8 F (36.6 C)   Resp 20   Ht 5' 2.5" (1.588 m)   Wt 186 lb (84.4 kg)   SpO2 97%   BMI 33.48 kg/m   Physical Exam Constitutional:      Appearance: Normal appearance.  HENT:     Head:  Normocephalic.  Cardiovascular:     Rate and Rhythm: Regular rhythm.     Heart sounds: Normal heart sounds.  Abdominal:     General: Bowel sounds are normal.  Musculoskeletal:        General: Swelling and tenderness present.     Cervical back: Neck supple.     Right lower leg: Edema present.     Left lower leg: Edema present.  Skin:    General: Skin is warm.     Findings: Erythema present.  Neurological:     Mental Status: She is alert and oriented to person, place, and time.     Assessment & Plan:   Problem List Items Addressed This Visit      Other   Generalized edema - Primary    Patient presents for bilateral lower extremity edema pitting +3.  Patient is new to practice and establishing care.  Patient reports that prior to coming to clinic PCP addressed edema, and congestive heart failure.  Patient is working on sending complete medical record.  Patient is currently on 40 mg Lasix twice daily.  Patient is reporting no therapeutic effect from medication.  Feet are progressively worse.  Mild discoloration bilateral lower extremity.  Tenderness and painful ankles.  Patient reports she is unable to wear shoes, she is unable to put on compression socks, she tries to elevate her feet above her heart but nothing has helped to alleviate symptoms. Referral to cardiology completed. Provided education to patient and family with printed handout given.      Relevant Orders   Ambulatory referral to Cardiology   Depression with anxiety    Patient is a 84 year old female complains of depression.  She complains of little interest or lack of pleasure in doing things, feeling down and hopeless.  Patient reports feeling tired and having little energy.  Onset was approximately few weeks ago.  She denies current suicidal and homicidal plans or intent.  Family history is not significant for depression.  She has not been treated in the past for depression or anxiety.  Started patient on 5 mg Lexapro  daily.  Patient will follow up in 4 weeks.  Provided education to patient with printed handout given. Medication sent to pharmacy.      Relevant Medications   escitalopram (LEXAPRO) 5 MG tablet      Outpatient Encounter Medications as of 11/09/2019  Medication Sig  . atenolol (TENORMIN) 25 MG tablet Take 1 tablet (25 mg total) by mouth daily.  . meloxicam (MOBIC) 15 MG tablet Take 1 tablet (15 mg total) by mouth daily.  . promethazine (PHENERGAN) 25 MG tablet Take 1 tablet (25  mg total) by mouth every 8 (eight) hours as needed for nausea or vomiting.  . RESTASIS 0.05 % ophthalmic emulsion   . traMADol (ULTRAM) 50 MG tablet Take 1 tablet (50 mg total) by mouth 2 (two) times daily.   No facility-administered encounter medications on file as of 11/09/2019.    Follow-up: No follow-ups on file.   Daryll Drown, NP

## 2019-11-09 NOTE — Assessment & Plan Note (Signed)
Patient is a 84 year old female complains of depression.  She complains of little interest or lack of pleasure in doing things, feeling down and hopeless.  Patient reports feeling tired and having little energy.  Onset was approximately few weeks ago.  She denies current suicidal and homicidal plans or intent.  Family history is not significant for depression.  She has not been treated in the past for depression or anxiety.  Started patient on 5 mg Lexapro daily.  Patient will follow up in 4 weeks.  Provided education to patient with printed handout given. Medication sent to pharmacy.

## 2019-11-09 NOTE — Assessment & Plan Note (Signed)
Patient presents for bilateral lower extremity edema pitting +3.  Patient is new to practice and establishing care.  Patient reports that prior to coming to clinic PCP addressed edema, and congestive heart failure.  Patient is working on sending complete medical record.  Patient is currently on 40 mg Lasix twice daily.  Patient is reporting no therapeutic effect from medication.  Feet are progressively worse.  Mild discoloration bilateral lower extremity.  Tenderness and painful ankles.  Patient reports she is unable to wear shoes, she is unable to put on compression socks, she tries to elevate her feet above her heart but nothing has helped to alleviate symptoms. Referral to cardiology completed. Provided education to patient and family with printed handout given.

## 2019-11-22 ENCOUNTER — Ambulatory Visit (INDEPENDENT_AMBULATORY_CARE_PROVIDER_SITE_OTHER): Payer: Medicare HMO | Admitting: Cardiology

## 2019-11-22 ENCOUNTER — Telehealth: Payer: Self-pay

## 2019-11-22 ENCOUNTER — Other Ambulatory Visit: Payer: Self-pay

## 2019-11-22 ENCOUNTER — Encounter: Payer: Self-pay | Admitting: Cardiology

## 2019-11-22 DIAGNOSIS — I10 Essential (primary) hypertension: Secondary | ICD-10-CM

## 2019-11-22 DIAGNOSIS — I209 Angina pectoris, unspecified: Secondary | ICD-10-CM | POA: Insufficient documentation

## 2019-11-22 DIAGNOSIS — R600 Localized edema: Secondary | ICD-10-CM | POA: Insufficient documentation

## 2019-11-22 HISTORY — DX: Essential (primary) hypertension: I10

## 2019-11-22 HISTORY — DX: Angina pectoris, unspecified: I20.9

## 2019-11-22 HISTORY — DX: Localized edema: R60.0

## 2019-11-22 MED ORDER — NITROGLYCERIN 0.4 MG SL SUBL: 0.4000 mg | SUBLINGUAL_TABLET | SUBLINGUAL | 6 refills | Status: DC | PRN

## 2019-11-22 MED ORDER — ASPIRIN EC 81 MG PO TBEC
81.0000 mg | DELAYED_RELEASE_TABLET | Freq: Every day | ORAL | 3 refills | Status: AC
Start: 2019-11-22 — End: ?

## 2019-11-22 NOTE — Telephone Encounter (Signed)
Attempted to call the pt re: her Cardiac Cath added on for 11/23/19. No answer unable to leave message.   Pre-catheterization call...scheduled at [facility] for: 11/23/19 Verified arrival time and place: [facility] Entr[facility]) at:5:30 am.     No solid food after midnight prior to cath, clear liquids until 5 AM day of procedure.   AM meds can be  taken pre-cath with sips of water including: ASA 81 mg  Hold lasix and  (possibly Lisinopril if GFR less than 60 as it has been in the past)    Confirmed patient has responsible adult to drive home post procedure and observe 24 hours after arriving home:   You are allowed ONE visitor in the waiting room during your procedure. Both you and your visitor must wear masks.    Were her labs drawn today?  Has the pt had the COVID vaccine to note in the chart?    Marland Kitchen

## 2019-11-22 NOTE — H&P (View-Only) (Signed)
Cardiology Office Note:    Date:  11/22/2019   ID:  Kelsey Mills, DOB 07/10/1935, MRN [id number]  PCP:  Raliegh Ip, DO  Cardiologist:  Garwin Brothers, MD   Referring MD: Daryll Drown, NP    ASSESSMENT:    1. Essential hypertension   2. Pedal edema   3. Angina pectoris (HCC)    PLAN:    In order of problems listed above:  1. Primary prevention stressed with the patient.  Importance of compliance with diet medication stressed and she vocalized understanding. 2. Angina pectoris: Patient symptoms are very concerning.  She was advised to take a coated aspirin on a daily basis.  Sublingual nitroglycerin prescription was sent, its protocol and 911 protocol explained and the patient vocalized understanding questions were answered to the patient's satisfaction.In view of the patient's symptoms, I discussed with the patient options for evaluation. Invasive and noninvasive options were given to the patient. I discussed stress testing and coronary angiography and left heart catheterization at length. Benefits, pros and cons of each approach were discussed at length. Patient had multiple questions which were answered to the patient's satisfaction. Patient opted for invasive evaluation and we will set up for coronary angiography and left heart catheterization. Further recommendations will be made based on the findings with coronary angiography. In the interim if the patient has any significant symptoms in hospital to the nearest emergency room. 3. I also gave her the option of CT coronary angiography but she prefers conventional coronary angiography.  I respect her wishes. 4. Bilateral pedal edema: We will get DVT study.  This has happened several months ago and is steady.  This happened after kyphoplasty. 5. Essential hypertension: Blood pressure stable 6. Patient will have complete blood work in preparation for coronary angiography.  Patient had multiple questions which were  answered to her satisfaction.  I will try to obtain records from her cardiologist in [place].  Patient and daughter had multiple questions which were answered to her satisfaction.   Medication Adjustments/Labs and Tests Ordered: Current medicines are reviewed at length with the patient today.  Concerns regarding medicines are outlined above.  No orders of the defined types were placed in this encounter.  No orders of the defined types were placed in this encounter.    History of Present Illness:    Kelsey Mills is a 84 y.o. female who is being seen today for the evaluation of chest tightness and bilateral pedal edema at the request of Daryll Drown, NP.  Patient is a 84 year old female.  She has past medical history of essential hypertension.  She has been taken care of by a cardiologist in [place] and she has relocated here.  She mentions to me that she had an episode of chest tightness which lasted 28 minutes.  She felt like somebody was sitting on her chest and it went to the neck.  No orthopnea or PND.  She is not on aspirin and nitroglycerin.  She has no known coronary artery disease.  She denies history of dyslipidemia or diabetes mellitus.  She leads a very sedentary lifestyle.  At the time of my evaluation, the patient is alert awake oriented and in no distress.  Past Medical History:  Diagnosis Date  . Arthritis   . Depression   . GERD (gastroesophageal reflux disease)   . Heart disease   . Hyperlipidemia   . Hypertension   . Insomnia   . Lymphocytic colitis   . Spinal stenosis, lumbar   .  Thyroid disease     Past Surgical History:  Procedure Laterality Date  . CHOLECYSTECTOMY    . SPINE SURGERY     2 surgeries     Current Medications: Current Meds  Medication Sig  . escitalopram (LEXAPRO) 5 MG tablet Take 1 tablet (5 mg total) by mouth daily.  . furosemide (LASIX) 40 MG tablet Take 80 mg by mouth daily.  Marland Kitchen gabapentin (NEURONTIN) 100 MG capsule Take 100 mg by mouth  3 (three) times daily.  Marland Kitchen levothyroxine (SYNTHROID) 25 MCG tablet Take 25 mcg by mouth daily before breakfast.  . lisinopril (ZESTRIL) 10 MG tablet Take 10 mg by mouth daily.  . Multiple Vitamins-Minerals (PRESERVISION AREDS PO) Take by mouth. One a day  . potassium chloride SA (KLOR-CON) 20 MEQ tablet Take 20 mEq by mouth daily.     Allergies:   Mesalamine and Trazodone and nefazodone   Social History   Socioeconomic History  . Marital status: Widowed    Spouse name: Not on file  . Number of children: 4  . Years of education: Not on file  . Highest education level: Not on file  Occupational History  . Not on file  Tobacco Use  . Smoking status: Never Smoker  . Smokeless tobacco: Never Used  Vaping Use  . Vaping Use: Never used  Substance and Sexual Activity  . Alcohol use: No  . Drug use: No  . Sexual activity: Not on file  Other Topics Concern  . Not on file  Social History Narrative   Lives at home - in studio apartment at daughters house   Social Determinants of Health   Financial Resource Strain:   . Difficulty of Paying Living Expenses:   Food Insecurity:   . Worried About Programme researcher, broadcasting/film/video in the Last Year:   . Barista in the Last Year:   Transportation Needs:   . Freight forwarder (Medical):   Marland Kitchen Lack of Transportation (Non-Medical):   Physical Activity:   . Days of Exercise per Week:   . Minutes of Exercise per Session:   Stress:   . Feeling of Stress :   Social Connections:   . Frequency of Communication with Friends and Family:   . Frequency of Social Gatherings with Friends and Family:   . Attends Religious Services:   . Active Member of Clubs or Organizations:   . Attends Banker Meetings:   Marland Kitchen Marital Status:      Family History: The patient's family history includes AAA (abdominal aortic aneurysm) in her brother; Arthritis in her daughter and son; COPD in her daughter, daughter, and son; Diabetes in her daughter and  mother; Heart attack in her daughter and mother; Heart disease in her brother, daughter, daughter, daughter, father, sister, and son; Hyperlipidemia in her daughter, daughter, and son; Hypertension in her daughter, daughter, and son; Other in her brother and father; Thyroid disease in her daughter, daughter, and daughter.  ROS:   Please see the history of present illness.    All other systems reviewed and are negative.  EKGs/Labs/Other Studies Reviewed:    The following studies were reviewed today: EKG reveals sinus rhythm and nonspecific ST-T changes.   Recent Labs: No results found for requested labs within last 8760 hours.  Recent Lipid Panel No results found for: CHOL, TRIG, HDL, CHOLHDL, VLDL, LDLCALC, LDLDIRECT  Physical Exam:    VS:  BP 118/72   Pulse 90   Ht 5' 2.5" (1.588  m)   Wt 186 lb (84.4 kg)   SpO2 94%   BMI 33.48 kg/m     Wt Readings from Last 3 Encounters:  11/22/19 186 lb (84.4 kg)  11/09/19 186 lb (84.4 kg)  03/09/14 201 lb 6.4 oz (91.4 kg)     GEN: Patient is in no acute distress HEENT: Normal NECK: No JVD; No carotid bruits LYMPHATICS: No lymphadenopathy CARDIAC: S1 S2 regular, 2/6 systolic murmur at the apex. RESPIRATORY:  Clear to auscultation without rales, wheezing or rhonchi  ABDOMEN: Soft, non-tender, non-distended MUSCULOSKELETAL: Bilateral 3+ edema; No deformity  SKIN: Warm and dry NEUROLOGIC:  Alert and oriented x 3 PSYCHIATRIC:  Normal affect    Signed, Garwin Brothers, MD  11/22/2019 10:09 AM    [facility]

## 2019-11-22 NOTE — Patient Instructions (Signed)
Medication Instructions:  Your physician has recommended you make the following change in your medication:   Take 81 mg coated Aspirin daily. Take Nitroglycerin as needed for chest pain or tightness.  *If you need a refill on your cardiac medications before your next appointment, please call your pharmacy*   Lab Work: None ordered If you have labs (blood work) drawn today and your tests are completely normal, you will receive your results only by: Marland Kitchen MyChart Message (if you have MyChart) OR . A paper copy in the mail If you have any lab test that is abnormal or we need to change your treatment, we will call you to review the results.   Testing/Procedures: Your physician has requested that you have an echocardiogram. Echocardiography is a painless test that uses sound waves to create images of your heart. It provides your doctor with information about the size and shape of your heart and how well your heart's chambers and valves are working. This procedure takes approximately one hour. There are no restrictions for this procedure.  Your physician has requested that you have a lower extremity venous duplex. This test is an ultrasound of the veins in the legs. It looks at venous blood flow that carries blood from the heart to the legs. Allow one hour for a Lower Venous exam. Allow thirty minutes for an Upper Venous exam. There are no restrictions or special instructions.     [facility] CARDIOVASCULAR DIVISION [facility] [place] [address], [address] [place] [address] Dept: [phone] Loc: [phone]  [place]  11/22/2019  You are scheduled for a Cardiac Catheterization on Thursday, July 1 with Dr. Nanetta Batty.  1. Please arrive at the [facility] (Main Entrance A) at [facility]: [address] [place], [place] [address] at 5:30 AM (This time is two hours before your procedure to ensure your preparation). Free valet  parking service is available.   Special note: Every effort is made to have your procedure done on time. Please understand that emergencies sometimes delay scheduled procedures.  2. Diet: Do not eat solid foods after midnight.  The patient may have clear liquids until 5am upon the day of the procedure.  3. Labs: You had your labs done in the office.  4. Medication instructions in preparation for your procedure:   Contrast Allergy: No   Stop taking, Lisinopril (Zestril or Prinivil) Thursday, July 1, Stop taking, Furosemide (Lasix)Thursday, July 1,  On the morning of your procedure, take your Aspirin and any morning medicines NOT listed above.  You may use sips of water.  5. Plan for one night stay--bring personal belongings. 6. Bring a current list of your medications and current insurance cards. 7. You MUST have a responsible person to drive you home. 8. Someone MUST be with you the first 24 hours after you arrive home or your discharge will be delayed. 9. Please wear clothes that are easy to get on and off and wear slip-on shoes.  Thank you for allowing [place] to care for you!   -- [facility]   Follow-Up: At [facility], you and your health needs are our priority.  As part of our continuing mission to provide you with exceptional heart care, we have created designated Provider Care Teams.  These Care Teams include your primary Cardiologist (physician) and Advanced Practice Providers (APPs -  Physician Assistants and Nurse Practitioners) who all work together to provide you with  the care you need, when you need it.  We recommend signing up for the patient portal called "MyChart".  Sign up information is provided on this After Visit Summary.  MyChart is used to connect with patients for Virtual Visits (Telemedicine).  Patients are able to view lab/test results, encounter notes, upcoming appointments, etc.  Non-urgent messages can be sent to your provider  as well.   To learn more about what you can do with MyChart, go to [email or web address].    Your next appointment:   1 month(s)  The format for your next appointment:   In Person  Provider:   Belva Crome, MD   Other Instructions  Vascular Ultrasound A vascular ultrasound is a painless test that is done to see if you have blood flow problems or clots in your blood vessels. It uses harmless sound waves to take pictures of the arteries and veins in your body. The pictures are taken by passing a device (transducer) over certain areas of your body. Tell a health care provider about:  Any allergies you have.  All medicines you are taking, including vitamins, herbs, eye drops, creams, and over-the-counter medicines.  Any blood disorders you have.  Any surgeries you have had.  Any medical conditions you have.  Whether you are pregnant or may be pregnant. What are the risks? Generally, this is a safe procedure. There are no known risks or complications that arise from having an ultrasound. What happens before the procedure?  If the ultrasound scan involves your upper abdomen, you may be told not to eat or chew gum the morning of your exam. Follow your health care provider's instructions.  Do not smoke or use nicotine products at least 30 minutes before the exam.  During the test, a gel will be applied to your skin. What happens during the procedure?   A gel will be applied to your skin. It may feel cool.  The transducer will be placed on the area to be examined.  Pictures will be taken. They will be displayed on one or more monitors that look like small television screens. What happens after the procedure?  You can safely drive home immediately after your exam.  You may resume your normal diet and activities.  Keep all follow-up visits as told by your health care provider. This is important.  It is up to you to get your test results. Ask your health care provider,  or the department that is doing the test: ? When will my results be ready? ? How will I get my results? ? What are my treatment options? ? What other tests do I need? ? What are my next steps? Summary  A vascular ultrasound is a painless test that is done to see if you have blood flow problems or clots in your blood vessels. It uses harmless sound waves to take pictures of the arteries and veins in your body.  Generally, this is a safe procedure. There are no known risks or complications that arise from having an ultrasound.  A gel will be applied to your skin. It may feel cool. The device that takes the pictures (transducer) will then be placed on the area to be examined.  It is up to you to get your test results. Ask your health care provider or the department that is doing the test when your results will be ready and how you will get your results. This information is not intended to replace advice given to you by your  health care provider. Make sure you discuss any questions you have with your health care provider. Document Revised: 08/30/2018 Document Reviewed: 06/17/2017 Elsevier Patient Education  2020 [employer].  Echocardiogram An echocardiogram is a procedure that uses painless sound waves (ultrasound) to produce an image of the heart. Images from an echocardiogram can provide important information about:  Signs of coronary artery disease (CAD).  Aneurysm detection. An aneurysm is a weak or damaged part of an artery wall that bulges out from the normal force of blood pumping through the body.  Heart size and shape. Changes in the size or shape of the heart can be associated with certain conditions, including heart failure, aneurysm, and CAD.  Heart muscle function.  Heart valve function.  Signs of a past heart attack.  Fluid buildup around the heart.  Thickening of the heart muscle.  A tumor or infectious growth around the heart valves. Tell a health care provider  about:  Any allergies you have.  All medicines you are taking, including vitamins, herbs, eye drops, creams, and over-the-counter medicines.  Any blood disorders you have.  Any surgeries you have had.  Any medical conditions you have.  Whether you are pregnant or may be pregnant. What are the risks? Generally, this is a safe procedure. However, problems may occur, including:  Allergic reaction to dye (contrast) that may be used during the procedure. What happens before the procedure? No specific preparation is needed. You may eat and drink normally. What happens during the procedure?   An IV tube may be inserted into one of your veins.  You may receive contrast through this tube. A contrast is an injection that improves the quality of the pictures from your heart.  A gel will be applied to your chest.  A wand-like tool (transducer) will be moved over your chest. The gel will help to transmit the sound waves from the transducer.  The sound waves will harmlessly bounce off of your heart to allow the heart images to be captured in real-time motion. The images will be recorded on a computer. The procedure may vary among health care providers and hospitals. What happens after the procedure?  You may return to your normal, everyday life, including diet, activities, and medicines, unless your health care provider tells you not to do that. Summary  An echocardiogram is a procedure that uses painless sound waves (ultrasound) to produce an image of the heart.  Images from an echocardiogram can provide important information about the size and shape of your heart, heart muscle function, heart valve function, and fluid buildup around your heart.  You do not need to do anything to prepare before this procedure. You may eat and drink normally.  After the echocardiogram is completed, you may return to your normal, everyday life, unless your health care provider tells you not to do  that. This information is not intended to replace advice given to you by your health care provider. Make sure you discuss any questions you have with your health care provider. Document Revised: 09/01/2018 Document Reviewed: 06/13/2016 Elsevier Patient Education  2020 Elsevier Inc. Nitroglycerin sublingual tablets What is this medicine? NITROGLYCERIN (nye troe GLI ser in) is a type of vasodilator. It relaxes blood vessels, increasing the blood and oxygen supply to your heart. This medicine is used to relieve chest pain caused by angina. It is also used to prevent chest pain before activities like climbing stairs, going outdoors in cold weather, or sexual activity. This medicine may be  used for other purposes; ask your health care provider or pharmacist if you have questions. COMMON BRAND NAME(S): Nitroquick, Nitrostat, Nitrotab What should I tell my health care provider before I take this medicine? They need to know if you have any of these conditions:  anemia  head injury, recent stroke, or bleeding in the brain  liver disease  previous heart attack  an unusual or allergic reaction to nitroglycerin, other medicines, foods, dyes, or preservatives  pregnant or trying to get pregnant  breast-feeding How should I use this medicine? Take this medicine by mouth as needed. At the first sign of an angina attack (chest pain or tightness) place one tablet under your tongue. You can also take this medicine 5 to 10 minutes before an event likely to produce chest pain. Follow the directions on the prescription label. Let the tablet dissolve under the tongue. Do not swallow whole. Replace the dose if you accidentally swallow it. It will help if your mouth is not dry. Saliva around the tablet will help it to dissolve more quickly. Do not eat or drink, smoke or chew tobacco while a tablet is dissolving. If you are not better within 5 minutes after taking ONE dose of nitroglycerin, call 9-1-1 immediately  to seek emergency medical care. Do not take more than 3 nitroglycerin tablets over 15 minutes. If you take this medicine often to relieve symptoms of angina, your doctor or health care professional may provide you with different instructions to manage your symptoms. If symptoms do not go away after following these instructions, it is important to call 9-1-1 immediately. Do not take more than 3 nitroglycerin tablets over 15 minutes. Talk to your pediatrician regarding the use of this medicine in children. Special care may be needed. Overdosage: If you think you have taken too much of this medicine contact a poison control center or emergency room at once. NOTE: This medicine is only for you. Do not share this medicine with others. What if I miss a dose? This does not apply. This medicine is only used as needed. What may interact with this medicine? Do not take this medicine with any of the following medications:  certain migraine medicines like ergotamine and dihydroergotamine (DHE)  medicines used to treat erectile dysfunction like sildenafil, tadalafil, and vardenafil  riociguat This medicine may also interact with the following medications:  alteplase  aspirin  heparin  medicines for high blood pressure  medicines for mental depression  other medicines used to treat angina  phenothiazines like chlorpromazine, mesoridazine, prochlorperazine, thioridazine This list may not describe all possible interactions. Give your health care provider a list of all the medicines, herbs, non-prescription drugs, or dietary supplements you use. Also tell them if you smoke, drink alcohol, or use illegal drugs. Some items may interact with your medicine. What should I watch for while using this medicine? Tell your doctor or health care professional if you feel your medicine is no longer working. Keep this medicine with you at all times. Sit or lie down when you take your medicine to prevent falling if  you feel dizzy or faint after using it. Try to remain calm. This will help you to feel better faster. If you feel dizzy, take several deep breaths and lie down with your feet propped up, or bend forward with your head resting between your knees. You may get drowsy or dizzy. Do not drive, use machinery, or do anything that needs mental alertness until you know how this drug affects you.  Do not stand or sit up quickly, especially if you are an older patient. This reduces the risk of dizzy or fainting spells. Alcohol can make you more drowsy and dizzy. Avoid alcoholic drinks. Do not treat yourself for coughs, colds, or pain while you are taking this medicine without asking your doctor or health care professional for advice. Some ingredients may increase your blood pressure. What side effects may I notice from receiving this medicine? Side effects that you should report to your doctor or health care professional as soon as possible:  blurred vision  dry mouth  skin rash  sweating  the feeling of extreme pressure in the head  unusually weak or tired Side effects that usually do not require medical attention (report to your doctor or health care professional if they continue or are bothersome):  flushing of the face or neck  headache  irregular heartbeat, palpitations  nausea, vomiting This list may not describe all possible side effects. Call your doctor for medical advice about side effects. You may report side effects to FDA at 1-800-FDA-1088. Where should I keep my medicine? Keep out of the reach of children. Store at room temperature between 20 and 25 degrees C (68 and 77 degrees F). Store in Retail buyer. Protect from light and moisture. Keep tightly closed. Throw away any unused medicine after the expiration date. NOTE: This sheet is a summary. It may not cover all possible information. If you have questions about this medicine, talk to your doctor, pharmacist, or health care  provider.  2020 Elsevier/Gold Standard (2013-03-09 17:57:36)  Aspirin and Your Heart  Aspirin is a medicine that prevents the cells in the blood that are used for clotting, called platelets, from sticking together. Aspirin can be used to help reduce the risk of blood clots, heart attacks, and other heart-related problems. Can I take aspirin? Your health care provider will help you determine whether it is safe and beneficial for you to take aspirin daily. Taking aspirin daily may be helpful if you:  Have had a heart attack or chest pain.  Are at risk for a heart attack.  Have undergone open-heart surgery, such as coronary artery bypass surgery (CABG).  Have had coronary angioplasty or a stent.  Have had certain types of stroke or transient ischemic attack (TIA).  Have peripheral artery disease (PAD).  Have chronic heart rhythm problems such as atrial fibrillation and cannot take an anticoagulant.  Have valve disease or have had surgery on a valve. What are the risks? Daily use of aspirin can cause side effects. Some of these include:  Bleeding. Bleeding problems can be minor or serious. An example of a minor problem is a cut that does not stop bleeding. An example of a more serious problem is stomach bleeding or, rarely, bleeding into the brain. Your risk of bleeding is increased if you are also taking non-steroidal anti-inflammatory drugs (NSAIDs).  Increased bruising.  Upset stomach.  An allergic reaction. People who have nasal polyps have an increased risk of developing an aspirin allergy. General guidelines  Take aspirin only as told by your health care provider. Make sure that you understand how much you should take and what form you should take. The two forms of aspirin are: ? Non-enteric-coated.This type of aspirin does not have a coating and is absorbed quickly. This type of aspirin also comes in a chewable form. ? Enteric-coated. This type of aspirin has a coating that  releases the medicine very slowly. Enteric-coated aspirin might cause  less stomach upset than non-enteric-coated aspirin. This type of aspirin should not be chewed or crushed.  Limit alcohol intake to no more than 1 drink a day for nonpregnant women and 2 drinks a day for men. Drinking alcohol increases your risk of bleeding. One drink equals 12 oz of beer, 5 oz of wine, or 1 oz of hard liquor. Contact a health care provider if you:  Have unusual bleeding or bruising.  Have stomach pain or nausea.  Have ringing in your ears.  Have an allergic reaction that causes: ? Hives. ? Itchy skin. ? Swelling of the lips, tongue, or face. Get help right away if you:  Notice that your bowel movements are bloody, dark red, or black in color.  Vomit or cough up blood.  Have blood in your urine.  Cough, have noisy breathing (wheeze), or feel short of breath.  Have chest pain, especially if the pain spreads to the arms, back, neck, or jaw.  Have a severe headache, or a headache with confusion, or dizziness. These symptoms may represent a serious problem that is an emergency. Do not wait to see if the symptoms will go away. Get medical help right away. Call your local emergency services (911 in the [place] Do not drive yourself to the hospital. Summary  Aspirin can be used to help reduce the risk of blood clots, heart attacks, and other heart-related problems.  Daily use of aspirin can increase your risk of side effects. Your health care provider will help you determine whether it is safe and beneficial for you to take aspirin daily.  Take aspirin only as told by your health care provider. Make sure that you understand how much you can take and what form you can take. This information is not intended to replace advice given to you by your health care provider. Make sure you discuss any questions you have with your health care provider. Document Revised: 03/11/2017 Document Reviewed:  03/11/2017 Elsevier Patient Education  2020 [employer]

## 2019-11-22 NOTE — Progress Notes (Signed)
Cardiology Office Note:    Date:  11/22/2019   ID:  Kelsey Mills, DOB 07/10/1935, MRN [id number]  PCP:  Raliegh Ip, DO  Cardiologist:  Garwin Brothers, MD   Referring MD: Daryll Drown, NP    ASSESSMENT:    1. Essential hypertension   2. Pedal edema   3. Angina pectoris (HCC)    PLAN:    In order of problems listed above:  1. Primary prevention stressed with the patient.  Importance of compliance with diet medication stressed and she vocalized understanding. 2. Angina pectoris: Patient symptoms are very concerning.  She was advised to take a coated aspirin on a daily basis.  Sublingual nitroglycerin prescription was sent, its protocol and 911 protocol explained and the patient vocalized understanding questions were answered to the patient's satisfaction.In view of the patient's symptoms, I discussed with the patient options for evaluation. Invasive and noninvasive options were given to the patient. I discussed stress testing and coronary angiography and left heart catheterization at length. Benefits, pros and cons of each approach were discussed at length. Patient had multiple questions which were answered to the patient's satisfaction. Patient opted for invasive evaluation and we will set up for coronary angiography and left heart catheterization. Further recommendations will be made based on the findings with coronary angiography. In the interim if the patient has any significant symptoms in hospital to the nearest emergency room. 3. I also gave her the option of CT coronary angiography but she prefers conventional coronary angiography.  I respect her wishes. 4. Bilateral pedal edema: We will get DVT study.  This has happened several months ago and is steady.  This happened after kyphoplasty. 5. Essential hypertension: Blood pressure stable 6. Patient will have complete blood work in preparation for coronary angiography.  Patient had multiple questions which were  answered to her satisfaction.  I will try to obtain records from her cardiologist in [place].  Patient and daughter had multiple questions which were answered to her satisfaction.   Medication Adjustments/Labs and Tests Ordered: Current medicines are reviewed at length with the patient today.  Concerns regarding medicines are outlined above.  No orders of the defined types were placed in this encounter.  No orders of the defined types were placed in this encounter.    History of Present Illness:    Kelsey Mills is a 84 y.o. female who is being seen today for the evaluation of chest tightness and bilateral pedal edema at the request of Daryll Drown, NP.  Patient is a 84 year old female.  She has past medical history of essential hypertension.  She has been taken care of by a cardiologist in [place] and she has relocated here.  She mentions to me that she had an episode of chest tightness which lasted 28 minutes.  She felt like somebody was sitting on her chest and it went to the neck.  No orthopnea or PND.  She is not on aspirin and nitroglycerin.  She has no known coronary artery disease.  She denies history of dyslipidemia or diabetes mellitus.  She leads a very sedentary lifestyle.  At the time of my evaluation, the patient is alert awake oriented and in no distress.  Past Medical History:  Diagnosis Date  . Arthritis   . Depression   . GERD (gastroesophageal reflux disease)   . Heart disease   . Hyperlipidemia   . Hypertension   . Insomnia   . Lymphocytic colitis   . Spinal stenosis, lumbar   .  Thyroid disease     Past Surgical History:  Procedure Laterality Date  . CHOLECYSTECTOMY    . SPINE SURGERY     2 surgeries     Current Medications: Current Meds  Medication Sig  . escitalopram (LEXAPRO) 5 MG tablet Take 1 tablet (5 mg total) by mouth daily.  . furosemide (LASIX) 40 MG tablet Take 80 mg by mouth daily.  . gabapentin (NEURONTIN) 100 MG capsule Take 100 mg by mouth  3 (three) times daily.  . levothyroxine (SYNTHROID) 25 MCG tablet Take 25 mcg by mouth daily before breakfast.  . lisinopril (ZESTRIL) 10 MG tablet Take 10 mg by mouth daily.  . Multiple Vitamins-Minerals (PRESERVISION AREDS PO) Take by mouth. One a day  . potassium chloride SA (KLOR-CON) 20 MEQ tablet Take 20 mEq by mouth daily.     Allergies:   Mesalamine and Trazodone and nefazodone   Social History   Socioeconomic History  . Marital status: Widowed    Spouse name: Not on file  . Number of children: 4  . Years of education: Not on file  . Highest education level: Not on file  Occupational History  . Not on file  Tobacco Use  . Smoking status: Never Smoker  . Smokeless tobacco: Never Used  Vaping Use  . Vaping Use: Never used  Substance and Sexual Activity  . Alcohol use: No  . Drug use: No  . Sexual activity: Not on file  Other Topics Concern  . Not on file  Social History Narrative   Lives at home - in studio apartment at daughters house   Social Determinants of Health   Financial Resource Strain:   . Difficulty of Paying Living Expenses:   Food Insecurity:   . Worried About Running Out of Food in the Last Year:   . Ran Out of Food in the Last Year:   Transportation Needs:   . Lack of Transportation (Medical):   . Lack of Transportation (Non-Medical):   Physical Activity:   . Days of Exercise per Week:   . Minutes of Exercise per Session:   Stress:   . Feeling of Stress :   Social Connections:   . Frequency of Communication with Friends and Family:   . Frequency of Social Gatherings with Friends and Family:   . Attends Religious Services:   . Active Member of Clubs or Organizations:   . Attends Club or Organization Meetings:   . Marital Status:      Family History: The patient's family history includes AAA (abdominal aortic aneurysm) in her brother; Arthritis in her daughter and son; COPD in her daughter, daughter, and son; Diabetes in her daughter and  mother; Heart attack in her daughter and mother; Heart disease in her brother, daughter, daughter, daughter, father, sister, and son; Hyperlipidemia in her daughter, daughter, and son; Hypertension in her daughter, daughter, and son; Other in her brother and father; Thyroid disease in her daughter, daughter, and daughter.  ROS:   Please see the history of present illness.    All other systems reviewed and are negative.  EKGs/Labs/Other Studies Reviewed:    The following studies were reviewed today: EKG reveals sinus rhythm and nonspecific ST-T changes.   Recent Labs: No results found for requested labs within last 8760 hours.  Recent Lipid Panel No results found for: CHOL, TRIG, HDL, CHOLHDL, VLDL, LDLCALC, LDLDIRECT  Physical Exam:    VS:  BP 118/72   Pulse 90   Ht 5' 2.5" (1.588   m)   Wt 186 lb (84.4 kg)   SpO2 94%   BMI 33.48 kg/m     Wt Readings from Last 3 Encounters:  11/22/19 186 lb (84.4 kg)  11/09/19 186 lb (84.4 kg)  03/09/14 201 lb 6.4 oz (91.4 kg)     GEN: Patient is in no acute distress HEENT: Normal NECK: No JVD; No carotid bruits LYMPHATICS: No lymphadenopathy CARDIAC: S1 S2 regular, 2/6 systolic murmur at the apex. RESPIRATORY:  Clear to auscultation without rales, wheezing or rhonchi  ABDOMEN: Soft, non-tender, non-distended MUSCULOSKELETAL: Bilateral 3+ edema; No deformity  SKIN: Warm and dry NEUROLOGIC:  Alert and oriented x 3 PSYCHIATRIC:  Normal affect    Signed, Garwin Brothers, MD  11/22/2019 10:09 AM    [facility]

## 2019-11-23 ENCOUNTER — Encounter (HOSPITAL_COMMUNITY): Payer: Self-pay | Admitting: Cardiovascular Disease

## 2019-11-23 ENCOUNTER — Ambulatory Visit (HOSPITAL_COMMUNITY)
Admission: RE | Disposition: A | Discharge status: Home / Self Care | Payer: Self-pay | Source: Home / Self Care | Attending: Cardiovascular Disease

## 2019-11-23 ENCOUNTER — Other Ambulatory Visit: Payer: Self-pay

## 2019-11-23 ENCOUNTER — Ambulatory Visit (HOSPITAL_COMMUNITY)
Admission: RE | Admit: 2019-11-23 | Discharge: 2019-11-23 | Disposition: A | Discharge status: Home / Self Care | Payer: Medicare HMO | Attending: Cardiovascular Disease | Admitting: Cardiovascular Disease

## 2019-11-23 DIAGNOSIS — I25119 Atherosclerotic heart disease of native coronary artery with unspecified angina pectoris: Secondary | ICD-10-CM | POA: Diagnosis present

## 2019-11-23 DIAGNOSIS — K219 Gastro-esophageal reflux disease without esophagitis: Secondary | ICD-10-CM | POA: Diagnosis not present

## 2019-11-23 DIAGNOSIS — I2582 Chronic total occlusion of coronary artery: Secondary | ICD-10-CM | POA: Insufficient documentation

## 2019-11-23 DIAGNOSIS — E785 Hyperlipidemia, unspecified: Secondary | ICD-10-CM | POA: Diagnosis not present

## 2019-11-23 DIAGNOSIS — I2510 Atherosclerotic heart disease of native coronary artery without angina pectoris: Secondary | ICD-10-CM

## 2019-11-23 DIAGNOSIS — Z79890 Hormone replacement therapy (postmenopausal): Secondary | ICD-10-CM | POA: Diagnosis not present

## 2019-11-23 DIAGNOSIS — E079 Disorder of thyroid, unspecified: Secondary | ICD-10-CM | POA: Insufficient documentation

## 2019-11-23 DIAGNOSIS — Z79899 Other long term (current) drug therapy: Secondary | ICD-10-CM | POA: Insufficient documentation

## 2019-11-23 DIAGNOSIS — R600 Localized edema: Secondary | ICD-10-CM | POA: Diagnosis not present

## 2019-11-23 DIAGNOSIS — F329 Major depressive disorder, single episode, unspecified: Secondary | ICD-10-CM | POA: Insufficient documentation

## 2019-11-23 DIAGNOSIS — I10 Essential (primary) hypertension: Secondary | ICD-10-CM | POA: Insufficient documentation

## 2019-11-23 DIAGNOSIS — I209 Angina pectoris, unspecified: Secondary | ICD-10-CM | POA: Diagnosis present

## 2019-11-23 HISTORY — PX: LEFT HEART CATH AND CORONARY ANGIOGRAPHY: CATH118249

## 2019-11-23 LAB — BASIC METABOLIC PANEL
Anion gap: 11 (ref 5–15)
BUN/Creatinine Ratio: 31 — ABNORMAL HIGH (ref 12–28)
BUN: 32 mg/dL — ABNORMAL HIGH (ref 8–27)
BUN: 27 mg/dL — ABNORMAL HIGH (ref 8–23)
CO2: 23 mmol/L (ref 20–29)
CO2: 27 mmol/L (ref 22–32)
Calcium: 9.5 mg/dL (ref 8.7–10.3)
Calcium: 8.8 mg/dL — ABNORMAL LOW (ref 8.9–10.3)
Chloride: 97 mmol/L (ref 96–106)
Chloride: 99 mmol/L (ref 98–111)
Creatinine, Ser: 1.02 mg/dL — ABNORMAL HIGH (ref 0.57–1.00)
Creatinine, Ser: 1.09 mg/dL — ABNORMAL HIGH (ref 0.44–1.00)
GFR calc Af Amer: 60 mL/min/{1.73_m2} (ref >59)
GFR calc Af Amer: 55 mL/min — ABNORMAL LOW (ref >60)
GFR calc non Af Amer: 52 mL/min/{1.73_m2} — ABNORMAL LOW (ref >59)
GFR calc non Af Amer: 48 mL/min — ABNORMAL LOW (ref >60)
Glucose, Bld: 119 mg/dL — ABNORMAL HIGH (ref 70–99)
Glucose: 102 mg/dL — ABNORMAL HIGH (ref 65–99)
Potassium: 3.7 mmol/L (ref 3.5–5.2)
Potassium: 3.2 mmol/L — ABNORMAL LOW (ref 3.5–5.1)
Sodium: 138 mmol/L (ref 134–144)
Sodium: 137 mmol/L (ref 135–145)

## 2019-11-23 LAB — CBC WITH DIFFERENTIAL/PLATELET
Basophils Absolute: 0.1 10*3/uL (ref 0.0–0.2)
Basos: 1 %
EOS (ABSOLUTE): 0 10*3/uL (ref 0.0–0.4)
Eos: 0 %
Hematocrit: 35 % (ref 34.0–46.6)
Hemoglobin: 11.9 g/dL (ref 11.1–15.9)
Immature Grans (Abs): 0.1 10*3/uL (ref 0.0–0.1)
Immature Granulocytes: 1 %
Lymphocytes Absolute: 1.8 10*3/uL (ref 0.7–3.1)
Lymphs: 22 %
MCH: 33.4 pg — ABNORMAL HIGH (ref 26.6–33.0)
MCHC: 34 g/dL (ref 31.5–35.7)
MCV: 98 fL — ABNORMAL HIGH (ref 79–97)
Monocytes Absolute: 0.9 10*3/uL (ref 0.1–0.9)
Monocytes: 10 %
Neutrophils Absolute: 5.6 10*3/uL (ref 1.4–7.0)
Neutrophils: 66 %
Platelets: 212 10*3/uL (ref 150–450)
RBC: 3.56 x10E6/uL — ABNORMAL LOW (ref 3.77–5.28)
RDW: 14.3 % (ref 11.7–15.4)
WBC: 8.5 10*3/uL (ref 3.4–10.8)

## 2019-11-23 LAB — CBC
HCT: 34.8 % — ABNORMAL LOW (ref 36.0–46.0)
Hemoglobin: 11.3 g/dL — ABNORMAL LOW (ref 12.0–15.0)
MCH: 33.4 pg (ref 26.0–34.0)
MCHC: 32.5 g/dL (ref 30.0–36.0)
MCV: 103 fL — ABNORMAL HIGH (ref 80.0–100.0)
Platelets: 194 10*3/uL (ref 150–400)
RBC: 3.38 MIL/uL — ABNORMAL LOW (ref 3.87–5.11)
RDW: 14.6 % (ref 11.5–15.5)
WBC: 7.7 10*3/uL (ref 4.0–10.5)
nRBC: 0 % (ref 0.0–0.2)

## 2019-11-23 SURGERY — LEFT HEART CATH AND CORONARY ANGIOGRAPHY
Anesthesia: LOCAL

## 2019-11-23 MED ORDER — FENTANYL CITRATE (PF) 100 MCG/2ML IJ SOLN
INTRAMUSCULAR | Status: DC | PRN
  Administered 2019-11-23: 25 ug via INTRAVENOUS

## 2019-11-23 MED ORDER — SODIUM CHLORIDE 0.9% FLUSH: 3.0000 mL | Freq: Two times a day (BID) | INTRAVENOUS | Status: DC

## 2019-11-23 MED ORDER — LIDOCAINE HCL (PF) 1 % IJ SOLN
INTRAMUSCULAR | Status: AC
  Filled 2019-11-23: qty 30

## 2019-11-23 MED ORDER — LABETALOL HCL 5 MG/ML IV SOLN: 10.0000 mg | INTRAVENOUS | Status: DC | PRN

## 2019-11-23 MED ORDER — HEPARIN SODIUM (PORCINE) 1000 UNIT/ML IJ SOLN
INTRAMUSCULAR | Status: DC | PRN
  Administered 2019-11-23: 4000 [IU] via INTRAVENOUS

## 2019-11-23 MED ORDER — NITROGLYCERIN 1 MG/10 ML FOR IR/CATH LAB
INTRA_ARTERIAL | Status: AC
  Filled 2019-11-23: qty 10

## 2019-11-23 MED ORDER — MIDAZOLAM HCL 2 MG/2ML IJ SOLN
INTRAMUSCULAR | Status: DC | PRN
  Administered 2019-11-23: 1 mg via INTRAVENOUS

## 2019-11-23 MED ORDER — SODIUM CHLORIDE 0.9 % IV SOLN: 250.0000 mL | INTRAVENOUS | Status: DC | PRN

## 2019-11-23 MED ORDER — SODIUM CHLORIDE 0.9 % WEIGHT BASED INFUSION: 1.0000 mL/kg/h | INTRAVENOUS | Status: DC

## 2019-11-23 MED ORDER — VERAPAMIL HCL 2.5 MG/ML IV SOLN
INTRA_ARTERIAL | Status: DC | PRN
  Administered 2019-11-23: 10 mL via INTRA_ARTERIAL

## 2019-11-23 MED ORDER — HEPARIN SODIUM (PORCINE) 1000 UNIT/ML IJ SOLN
INTRAMUSCULAR | Status: AC
  Filled 2019-11-23: qty 1

## 2019-11-23 MED ORDER — ACETAMINOPHEN 325 MG PO TABS: 650.0000 mg | ORAL_TABLET | ORAL | Status: DC | PRN

## 2019-11-23 MED ORDER — ONDANSETRON HCL 4 MG/2ML IJ SOLN: 4.0000 mg | Freq: Four times a day (QID) | INTRAMUSCULAR | Status: DC | PRN

## 2019-11-23 MED ORDER — SODIUM CHLORIDE 0.9% FLUSH: 3.0000 mL | INTRAVENOUS | Status: DC | PRN

## 2019-11-23 MED ORDER — POTASSIUM CHLORIDE 20 MEQ/15ML (10%) PO SOLN
40.0000 meq | ORAL | Status: DC
  Filled 2019-11-23: qty 30

## 2019-11-23 MED ORDER — MIDAZOLAM HCL 2 MG/2ML IJ SOLN
INTRAMUSCULAR | Status: AC
  Filled 2019-11-23: qty 2

## 2019-11-23 MED ORDER — HYDRALAZINE HCL 20 MG/ML IJ SOLN: 10.0000 mg | INTRAMUSCULAR | Status: DC | PRN

## 2019-11-23 MED ORDER — HEPARIN (PORCINE) IN NACL 1000-0.9 UT/500ML-% IV SOLN
INTRAVENOUS | Status: DC | PRN
  Administered 2019-11-23: 500 mL

## 2019-11-23 MED ORDER — POTASSIUM CHLORIDE 20 MEQ/15ML (10%) PO SOLN
ORAL | Status: AC
  Filled 2019-11-23: qty 30

## 2019-11-23 MED ORDER — SODIUM CHLORIDE 0.9 % IV SOLN: INTRAVENOUS | Status: AC

## 2019-11-23 MED ORDER — SODIUM CHLORIDE 0.9 % WEIGHT BASED INFUSION: 3.0000 mL/kg/h | INTRAVENOUS | Status: AC

## 2019-11-23 MED ORDER — FENTANYL CITRATE (PF) 100 MCG/2ML IJ SOLN
INTRAMUSCULAR | Status: AC
  Filled 2019-11-23: qty 2

## 2019-11-23 MED ORDER — MORPHINE SULFATE (PF) 2 MG/ML IV SOLN: 2.0000 mg | INTRAVENOUS | Status: DC | PRN

## 2019-11-23 MED ORDER — ASPIRIN 81 MG PO CHEW: 81.0000 mg | CHEWABLE_TABLET | ORAL | Status: DC

## 2019-11-23 MED ORDER — LIDOCAINE HCL (PF) 1 % IJ SOLN
INTRAMUSCULAR | Status: DC | PRN
  Administered 2019-11-23: 2 mL

## 2019-11-23 MED ORDER — HEPARIN (PORCINE) IN NACL 1000-0.9 UT/500ML-% IV SOLN
INTRAVENOUS | Status: AC
  Filled 2019-11-23: qty 1000

## 2019-11-23 SURGICAL SUPPLY — 11 items
CATH INFINITI 5FR ANG PIGTAIL (CATHETERS) ×2 IMPLANT
CATH OPTITORQUE TIG 4.0 5F (CATHETERS) ×2 IMPLANT
GLIDESHEATH SLEND A-KIT 6F 22G (SHEATH) ×2 IMPLANT
GUIDEWIRE INQWIRE 1.5J.035X260 (WIRE) ×1 IMPLANT
INQWIRE 1.5J .035X260CM (WIRE) ×2
KIT HEART LEFT (KITS) ×2 IMPLANT
PACK CARDIAC CATHETERIZATION (CUSTOM PROCEDURE TRAY) ×2 IMPLANT
SYR MEDRAD MARK 7 150ML (SYRINGE) ×2 IMPLANT
TRANSDUCER W/STOPCOCK (MISCELLANEOUS) ×2 IMPLANT
TUBING CIL FLEX 10 FLL-RA (TUBING) ×2 IMPLANT
WIRE HI TORQ VERSACORE-J 145CM (WIRE) ×2 IMPLANT

## 2019-11-23 NOTE — Progress Notes (Signed)
Patient and daugther was given discharge instructions. Both verbalized understanding. 

## 2019-11-23 NOTE — Interval H&P Note (Signed)
Cath Lab Visit (complete for each Cath Lab visit)  Clinical Evaluation Leading to the Procedure:   ACS: No.  Non-ACS:    Anginal Classification: CCS II  Anti-ischemic medical therapy: No Therapy  Non-Invasive Test Results: No non-invasive testing performed  Prior CABG: No previous CABG      History and Physical Interval Note:  11/23/2019 7:34 AM  Kelsey Mills  has presented today for surgery, with the diagnosis of Angina.  The various methods of treatment have been discussed with the patient and family. After consideration of risks, benefits and other options for treatment, the patient has consented to  Procedure(s): LEFT HEART CATH AND CORONARY ANGIOGRAPHY (N/A) as a surgical intervention.  The patient's history has been reviewed, patient examined, no change in status, stable for surgery.  I have reviewed the patient's chart and labs.  Questions were answered to the patient's satisfaction.     Nanetta Batty

## 2019-11-23 NOTE — Discharge Instructions (Signed)
Drink plenty of fluid for 48 hours and keep wrist elevated at heart level for 24 hours  Radial Site Care   This sheet gives you information about how to care for yourself after your procedure. Your health care provider may also give you more specific instructions. If you have problems or questions, contact your health care provider. What can I expect after the procedure? After the procedure, it is common to have:  Bruising and tenderness at the catheter insertion area. Follow these instructions at home: Medicines  Take over-the-counter and prescription medicines only as told by your health care provider. Insertion site care 1. Follow instructions from your health care provider about how to take care of your insertion site. Make sure you: ? Wash your hands with soap and water before you change your bandage (dressing). If soap and water are not available, use hand sanitizer. ? remove your dressing as told by your health care provider. In 24 hours 2. Check your insertion site every day for signs of infection. Check for: ? Redness, swelling, or pain. ? Fluid or blood. ? Pus or a bad smell. ? Warmth. 3. Do not take baths, swim, or use a hot tub until your health care provider approves. 4. You may shower 24-48 hours after the procedure, or as directed by your health care provider. ? Remove the dressing and gently wash the site with plain soap and water. ? Pat the area dry with a clean towel. ? Do not rub the site. That could cause bleeding. 5. Do not apply powder or lotion to the site. Activity   1. For 24 hours after the procedure, or as directed by your health care provider: ? Do not flex or bend the affected arm. ? Do not push or pull heavy objects with the affected arm. ? Do not drive yourself home from the hospital or clinic. You may drive 24 hours after the procedure unless your health care provider tells you not to. ? Do not operate machinery or power tools. 2. Do not lift  anything that is heavier than 10 lb (4.5 kg), or the limit that you are told, until your health care provider says that it is safe. For 4 days 3. Ask your health care provider when it is okay to: ? Return to work or school. ? Resume usual physical activities or sports. ? Resume sexual activity. General instructions  If the catheter site starts to bleed, raise your arm and put firm pressure on the site. If the bleeding does not stop, get help right away. This is a medical emergency.  If you went home on the same day as your procedure, a responsible adult should be with you for the first 24 hours after you arrive home.  Keep all follow-up visits as told by your health care provider. This is important. Contact a health care provider if:  You have a fever.  You have redness, swelling, or yellow drainage around your insertion site. Get help right away if:  You have unusual pain at the radial site.  The catheter insertion area swells very fast.  The insertion area is bleeding, and the bleeding does not stop when you hold steady pressure on the area.  Your arm or hand becomes pale, cool, tingly, or numb. These symptoms may represent a serious problem that is an emergency. Do not wait to see if the symptoms will go away. Get medical help right away. Call your local emergency services (911 in the [place] Do not   drive yourself to the hospital. Summary  After the procedure, it is common to have bruising and tenderness at the site.  Follow instructions from your health care provider about how to take care of your radial site wound. Check the wound every day for signs of infection.  Do not lift anything that is heavier than 10 lb (4.5 kg), or the limit that you are told, until your health care provider says that it is safe. This information is not intended to replace advice given to you by your health care provider. Make sure you discuss any questions you have with your health care  provider. Document Revised: 06/16/2017 Document Reviewed: 06/16/2017 [employer]vier Patient Education  2020 [employer]  

## 2019-11-24 MED FILL — Potassium Chloride Oral Soln 10% (20 MEQ/15ML): ORAL | Qty: 30 | Status: AC

## 2019-11-29 ENCOUNTER — Other Ambulatory Visit (HOSPITAL_BASED_OUTPATIENT_CLINIC_OR_DEPARTMENT_OTHER): Payer: Medicare HMO

## 2019-11-30 ENCOUNTER — Encounter (HOSPITAL_COMMUNITY): Payer: Self-pay | Admitting: Emergency Medicine

## 2019-11-30 ENCOUNTER — Other Ambulatory Visit: Payer: Self-pay

## 2019-11-30 ENCOUNTER — Emergency Department (HOSPITAL_COMMUNITY)
Admission: EM | Admit: 2019-11-30 | Discharge: 2019-11-30 | Disposition: A | Discharge status: Home / Self Care | Payer: Medicare HMO | Attending: Emergency Medicine | Admitting: Emergency Medicine

## 2019-11-30 ENCOUNTER — Emergency Department (HOSPITAL_COMMUNITY): Payer: Medicare HMO

## 2019-11-30 ENCOUNTER — Ambulatory Visit (HOSPITAL_BASED_OUTPATIENT_CLINIC_OR_DEPARTMENT_OTHER)
Admission: RE | Admit: 2019-11-30 | Discharge: 2019-11-30 | Disposition: A | Discharge status: Home / Self Care | Payer: Medicare HMO | Source: Ambulatory Visit | Attending: Cardiology | Admitting: Cardiology

## 2019-11-30 DIAGNOSIS — Y999 Unspecified external cause status: Secondary | ICD-10-CM | POA: Diagnosis not present

## 2019-11-30 DIAGNOSIS — Y939 Activity, unspecified: Secondary | ICD-10-CM | POA: Insufficient documentation

## 2019-11-30 DIAGNOSIS — R600 Localized edema: Secondary | ICD-10-CM

## 2019-11-30 DIAGNOSIS — R0789 Other chest pain: Secondary | ICD-10-CM | POA: Insufficient documentation

## 2019-11-30 DIAGNOSIS — Y929 Unspecified place or not applicable: Secondary | ICD-10-CM | POA: Insufficient documentation

## 2019-11-30 DIAGNOSIS — S22000A Wedge compression fracture of unspecified thoracic vertebra, initial encounter for closed fracture: Secondary | ICD-10-CM | POA: Diagnosis not present

## 2019-11-30 DIAGNOSIS — I209 Angina pectoris, unspecified: Secondary | ICD-10-CM | POA: Diagnosis not present

## 2019-11-30 DIAGNOSIS — I10 Essential (primary) hypertension: Secondary | ICD-10-CM | POA: Insufficient documentation

## 2019-11-30 DIAGNOSIS — R2243 Localized swelling, mass and lump, lower limb, bilateral: Secondary | ICD-10-CM | POA: Diagnosis present

## 2019-11-30 DIAGNOSIS — R0602 Shortness of breath: Secondary | ICD-10-CM | POA: Diagnosis not present

## 2019-11-30 DIAGNOSIS — X58XXXA Exposure to other specified factors, initial encounter: Secondary | ICD-10-CM | POA: Insufficient documentation

## 2019-11-30 DIAGNOSIS — Z79899 Other long term (current) drug therapy: Secondary | ICD-10-CM | POA: Insufficient documentation

## 2019-11-30 DIAGNOSIS — I82411 Acute embolism and thrombosis of right femoral vein: Secondary | ICD-10-CM | POA: Insufficient documentation

## 2019-11-30 LAB — I-STAT CHEM 8, ED
BUN: 22 mg/dL (ref 8–23)
Calcium, Ion: 1.1 mmol/L — ABNORMAL LOW (ref 1.15–1.40)
Chloride: 99 mmol/L (ref 98–111)
Creatinine, Ser: 0.9 mg/dL (ref 0.44–1.00)
Glucose, Bld: 122 mg/dL — ABNORMAL HIGH (ref 70–99)
HCT: 36 % (ref 36.0–46.0)
Hemoglobin: 12.2 g/dL (ref 12.0–15.0)
Potassium: 3.6 mmol/L (ref 3.5–5.1)
Sodium: 139 mmol/L (ref 135–145)
TCO2: 26 mmol/L (ref 22–32)

## 2019-11-30 LAB — CBC WITH DIFFERENTIAL/PLATELET
Abs Immature Granulocytes: 0.1 10*3/uL — ABNORMAL HIGH (ref 0.00–0.07)
Basophils Absolute: 0.1 10*3/uL (ref 0.0–0.1)
Basophils Relative: 1 %
Eosinophils Absolute: 0 10*3/uL (ref 0.0–0.5)
Eosinophils Relative: 0 %
HCT: 36.6 % (ref 36.0–46.0)
Hemoglobin: 11.9 g/dL — ABNORMAL LOW (ref 12.0–15.0)
Immature Granulocytes: 1 %
Lymphocytes Relative: 24 %
Lymphs Abs: 2.2 10*3/uL (ref 0.7–4.0)
MCH: 33.6 pg (ref 26.0–34.0)
MCHC: 32.5 g/dL (ref 30.0–36.0)
MCV: 103.4 fL — ABNORMAL HIGH (ref 80.0–100.0)
Monocytes Absolute: 1 10*3/uL (ref 0.1–1.0)
Monocytes Relative: 11 %
Neutro Abs: 5.9 10*3/uL (ref 1.7–7.7)
Neutrophils Relative %: 63 %
Platelets: 212 10*3/uL (ref 150–400)
RBC: 3.54 MIL/uL — ABNORMAL LOW (ref 3.87–5.11)
RDW: 14.4 % (ref 11.5–15.5)
WBC: 9.3 10*3/uL (ref 4.0–10.5)
nRBC: 0 % (ref 0.0–0.2)

## 2019-11-30 LAB — PROTIME-INR
INR: 1.2 (ref 0.8–1.2)
Prothrombin Time: 14.4 seconds (ref 11.4–15.2)

## 2019-11-30 LAB — COMPREHENSIVE METABOLIC PANEL
ALT: 23 U/L (ref 0–44)
AST: 21 U/L (ref 15–41)
Albumin: 3.7 g/dL (ref 3.5–5.0)
Alkaline Phosphatase: 115 U/L (ref 38–126)
Anion gap: 13 (ref 5–15)
BUN: 22 mg/dL (ref 8–23)
CO2: 25 mmol/L (ref 22–32)
Calcium: 9.2 mg/dL (ref 8.9–10.3)
Chloride: 101 mmol/L (ref 98–111)
Creatinine, Ser: 0.91 mg/dL (ref 0.44–1.00)
GFR calc Af Amer: >60 mL/min (ref >60)
GFR calc non Af Amer: 59 mL/min — ABNORMAL LOW (ref >60)
Glucose, Bld: 127 mg/dL — ABNORMAL HIGH (ref 70–99)
Potassium: 3.7 mmol/L (ref 3.5–5.1)
Sodium: 139 mmol/L (ref 135–145)
Total Bilirubin: 1.2 mg/dL (ref 0.3–1.2)
Total Protein: 6.5 g/dL (ref 6.5–8.1)

## 2019-11-30 LAB — TROPONIN I (HIGH SENSITIVITY): Troponin I (High Sensitivity): 13 ng/L (ref <18)

## 2019-11-30 LAB — BRAIN NATRIURETIC PEPTIDE: B Natriuretic Peptide: 66.7 pg/mL (ref 0.0–100.0)

## 2019-11-30 MED ORDER — RIVAROXABAN (XARELTO) VTE STARTER PACK (15 & 20 MG)
ORAL_TABLET | ORAL | 0 refills | Status: DC
Start: 2019-11-30 — End: 2020-01-19

## 2019-11-30 MED ORDER — RIVAROXABAN 15 MG PO TABS
15.0000 mg | ORAL_TABLET | Freq: Once | ORAL | Status: AC
  Administered 2019-11-30: 15 mg via ORAL
  Filled 2019-11-30: qty 1

## 2019-11-30 MED ORDER — ACETAMINOPHEN 325 MG PO TABS
650.0000 mg | ORAL_TABLET | Freq: Once | ORAL | Status: AC
  Administered 2019-11-30: 650 mg via ORAL
  Filled 2019-11-30: qty 2

## 2019-11-30 MED ORDER — RIVAROXABAN (XARELTO) EDUCATION KIT FOR DVT/PE PATIENTS
PACK | Freq: Once | Status: AC
  Filled 2019-11-30: qty 1

## 2019-11-30 MED ORDER — IOHEXOL 350 MG/ML SOLN
55.0000 mL | Freq: Once | INTRAVENOUS | Status: AC | PRN
  Administered 2019-11-30: 55 mL via INTRAVENOUS

## 2019-11-30 NOTE — ED Provider Notes (Signed)
[facility] EMERGENCY DEPARTMENT Provider Note   CSN: [id number] Arrival date & time: 11/30/19  1513     History Chief Complaint  Patient presents with  . dvt    Marquerite Forsman is a 84 y.o. female history of hypertension, hyperlipidemia, here presenting with leg swelling, shortness of breath.  Patient just moved here from [place].  At baseline patient only walks with a walker and is relatively immobile.  Patient has been having right leg swelling for the last 3 months.  Also some intermittent shortness of breath going on for the last several months as well.  Patient saw cardiology and had extensive work-up already. She had a cath that showed nonobstructive disease and an echo that was unremarkable. She was sent for DVT study and showed right leg DVT today. She is sent here for a CTA chest to rule out a PE.  She states that her shortness of breath is stable.  She denies any chest pain currently.  She has chronic back pain.  The history is provided by the patient.       Past Medical History:  Diagnosis Date  . Arthritis   . Depression   . GERD (gastroesophageal reflux disease)   . Heart disease   . Hyperlipidemia   . Hypertension   . Insomnia   . Lymphocytic colitis   . Spinal stenosis, lumbar   . Thyroid disease     Patient Active Problem List   Diagnosis Date Noted  . Essential hypertension 11/22/2019  . Pedal edema 11/22/2019  . Angina pectoris (HCC) 11/22/2019  . Generalized edema 11/09/2019  . Depression with anxiety 11/09/2019  . Weight gain 01/16/2014  . Arthritis 01/12/2013  . Unspecified essential hypertension 01/12/2013  . Noninfectious gastroenteritis and colitis 01/12/2013  . Insomnia 01/12/2013    Past Surgical History:  Procedure Laterality Date  . CHOLECYSTECTOMY    . LEFT HEART CATH AND CORONARY ANGIOGRAPHY N/A 11/23/2019   Procedure: LEFT HEART CATH AND CORONARY ANGIOGRAPHY;  Surgeon: Runell Gess, MD;  Location: [facility] INVASIVE CV  LAB;  Service: Cardiovascular;  Laterality: N/A;  . SPINE SURGERY     2 surgeries      OB History   No obstetric history on file.     Family History  Problem Relation Age of Onset  . Heart attack Mother        72  . Diabetes Mother   . Other Father        black lung   . Heart disease Father   . Heart disease Sister        stents   . Heart disease Brother   . Other Brother        parkinson   . COPD Daughter   . Diabetes Daughter   . Heart disease Daughter   . Hypertension Daughter   . Hyperlipidemia Daughter   . Thyroid disease Daughter   . COPD Son   . Heart disease Son   . Hypertension Son   . Hyperlipidemia Son   . Arthritis Son        rheum   . AAA (abdominal aortic aneurysm) Brother   . COPD Daughter   . Heart disease Daughter   . Hypertension Daughter   . Thyroid disease Daughter   . Arthritis Daughter        rheum   . Heart disease Daughter   . Heart attack Daughter   . Hyperlipidemia Daughter   . Thyroid disease Daughter  Social History   Tobacco Use  . Smoking status: Never Smoker  . Smokeless tobacco: Never Used  Vaping Use  . Vaping Use: Never used  Substance Use Topics  . Alcohol use: No  . Drug use: No    Home Medications Prior to Admission medications   Medication Sig Start Date End Date Taking? Authorizing Provider  acetaminophen (TYLENOL) 650 MG CR tablet Take 1,300 mg by mouth in the morning.   Yes [provider]  escitalopram (LEXAPRO) 5 MG tablet Take 1 tablet (5 mg total) by mouth daily. 11/09/19  Yes Daryll Drown, NP  furosemide (LASIX) 40 MG tablet Take 80 mg by mouth daily.   Yes [provider]  gabapentin (NEURONTIN) 100 MG capsule Take 100 mg by mouth daily as needed (pain).    Yes [provider]  levothyroxine (SYNTHROID) 25 MCG tablet Take 25 mcg by mouth daily before breakfast.   Yes [provider]  lisinopril (ZESTRIL) 10 MG tablet Take 10 mg by mouth daily.   Yes [provider]  Multiple Vitamins-Minerals (PRESERVISION AREDS PO) Take 1 capsule by mouth daily.    Yes [provider]  nitroGLYCERIN (NITROSTAT) 0.4 MG SL tablet Place 1 tablet (0.4 mg total) under the tongue every 5 (five) minutes as needed. Patient taking differently: Place 0.4 mg under the tongue every 5 (five) minutes as needed for chest pain.  11/22/19 02/20/20 Yes Revankar, Aundra Dubin, MD  potassium chloride SA (KLOR-CON) 20 MEQ tablet Take 20 mEq by mouth daily.   Yes [provider]  aspirin EC 81 MG tablet Take 1 tablet (81 mg total) by mouth daily. Swallow whole. 11/22/19   Revankar, Aundra Dubin, MD    Allergies    Tape, Trazodone and nefazodone, and Mesalamine  Review of Systems   Review of Systems  Cardiovascular: Positive for leg swelling.  All other systems reviewed and are negative.   Physical Exam Updated Vital Signs BP 135/71   Pulse 73   Temp 98.2 F (36.8 C) (Oral)   Resp 17   Ht 5\' 2"  (1.575 m)   Wt 83.5 kg   SpO2 97%   BMI 33.65 kg/m   Physical Exam Vitals and nursing note reviewed.  Constitutional:      Appearance: Normal appearance.  HENT:     Head: Normocephalic.     Nose: Nose normal.     Mouth/Throat:     Mouth: Mucous membranes are moist.  Eyes:     Extraocular Movements: Extraocular movements intact.     Pupils: Pupils are equal, round, and reactive to light.  Cardiovascular:     Rate and Rhythm: Normal rate and regular rhythm.     Pulses: Normal pulses.     Heart sounds: Normal heart sounds.  Pulmonary:     Effort: Pulmonary effort is normal.     Breath sounds: Normal breath sounds.  Abdominal:     General: Abdomen is flat.     Palpations: Abdomen is soft.  Musculoskeletal:     Cervical back: Normal range of motion.     Comments: 2+ edema R leg with calf tenderness. 1+ edema L leg and no calf tenderness. Neurovascular intact bilateral lower extremities. Mild upper thoracic tenderness. No saddle anesthesia.   Neurological:       General: No focal deficit present.     Mental Status: She is alert and oriented to person, place, and time.  Psychiatric:        Mood  and Affect: Mood normal.     ED Results / Procedures / Treatments   Labs (all labs ordered are listed, but only abnormal results are displayed) Labs Reviewed  CBC WITH DIFFERENTIAL/PLATELET - Abnormal; Notable for the following components:      Result Value   RBC 3.54 (*)    Hemoglobin 11.9 (*)    MCV 103.4 (*)    Abs Immature Granulocytes 0.10 (*)    All other components within normal limits  I-STAT CHEM 8, ED - Abnormal; Notable for the following components:   Glucose, Bld 122 (*)    Calcium, Ion 1.10 (*)    All other components within normal limits  PROTIME-INR  COMPREHENSIVE METABOLIC PANEL  BRAIN NATRIURETIC PEPTIDE  TROPONIN I (HIGH SENSITIVITY)    EKG EKG Interpretation  Date/Time:  Thursday November 30 2019 15:23:25 EDT Ventricular Rate:  98 PR Interval:  158 QRS Duration: 72 QT Interval:  350 QTC Calculation: 446 R Axis:   69 Text Interpretation: Normal sinus rhythm Low voltage QRS Borderline ECG No significant change since last tracing Confirmed by Richardean Canal [phone]) on 11/30/2019 8:50:18 PM   Radiology DG Chest 2 View  Result Date: 11/30/2019 CLINICAL DATA:  Shortness of breath EXAM: CHEST - 2 VIEW COMPARISON:  None. FINDINGS: The lung volumes are somewhat low. There is elevation of the right hemidiaphragm. The heart size is borderline enlarged. There is no pneumothorax or large focal infiltrate. There are age-indeterminate compression fractures throughout the visualized portions of the thoracolumbar spine. The patient is status post prior vertebral augmentation of the L2 vertebral body. There is no pneumothorax. IMPRESSION: 1. No active cardiopulmonary disease. 2. Age-indeterminate compression fractures throughout the visualized portions of the thoracolumbar spine. Electronically Signed   By: Katherine Mantle M.D.   On:  11/30/2019 16:14   VAS [place] LOWER EXTREMITY VENOUS (DVT)  Result Date: 11/30/2019  Lower Venous DVTStudy Indications: Swelling.  Comparison Study: no prior Performing Technologist: Blanch Media RVS  Examination Guidelines: A complete evaluation includes B-mode imaging, spectral Doppler, color Doppler, and power Doppler as needed of all accessible portions of each vessel. Bilateral testing is considered an integral part of a complete examination. Limited examinations for reoccurring indications may be performed as noted. The reflux portion of the exam is performed with the patient in reverse Trendelenburg.  +---------+---------------+---------+-----------+----------+--------------+ RIGHT    CompressibilityPhasicitySpontaneityPropertiesThrombus Aging +---------+---------------+---------+-----------+----------+--------------+ CFV      None           No       No                                  +---------+---------------+---------+-----------+----------+--------------+ SFJ      Full                                                        +---------+---------------+---------+-----------+----------+--------------+ FV Prox  None                                                        +---------+---------------+---------+-----------+----------+--------------+ FV Mid   None                                                        +---------+---------------+---------+-----------+----------+--------------+  FV DistalNone                                                        +---------+---------------+---------+-----------+----------+--------------+ PFV      None                                                        +---------+---------------+---------+-----------+----------+--------------+ POP      None           Yes      Yes                                 +---------+---------------+---------+-----------+----------+--------------+ PTV      Partial                                                      +---------+---------------+---------+-----------+----------+--------------+ PERO                                                  Not visualized +---------+---------------+---------+-----------+----------+--------------+ EIV                     Yes      Yes                                 +---------+---------------+---------+-----------+----------+--------------+   +---------+---------------+---------+-----------+----------+--------------+ LEFT     CompressibilityPhasicitySpontaneityPropertiesThrombus Aging +---------+---------------+---------+-----------+----------+--------------+ CFV      Full           Yes      Yes                                 +---------+---------------+---------+-----------+----------+--------------+ SFJ      Full                                                        +---------+---------------+---------+-----------+----------+--------------+ FV Prox  Full                                                        +---------+---------------+---------+-----------+----------+--------------+ FV Mid   Full                                                        +---------+---------------+---------+-----------+----------+--------------+  FV DistalFull                                                        +---------+---------------+---------+-----------+----------+--------------+ PFV      Full                                                        +---------+---------------+---------+-----------+----------+--------------+ POP      Full           Yes      Yes                                 +---------+---------------+---------+-----------+----------+--------------+ PTV      Full                                                        +---------+---------------+---------+-----------+----------+--------------+ PERO     Full                                                         +---------+---------------+---------+-----------+----------+--------------+     Summary: RIGHT: - Findings consistent with age indeterminate deep vein thrombosis involving the right common femoral vein, right femoral vein, right proximal profunda vein, right popliteal vein, and right posterior tibial veins. - No cystic structure found in the popliteal fossa.  LEFT: - No evidence of deep vein thrombosis in the lower extremity. No indirect evidence of obstruction proximal to the inguinal ligament. - No cystic structure found in the popliteal fossa.  *See table(s) above for measurements and observations. Electronically signed by Lemar LivingsBrandon Cain MD on 11/30/2019 at 8:15:11 PM.    Final     Procedures Procedures (including critical care time)  Medications Ordered in ED Medications  acetaminophen (TYLENOL) tablet 650 mg (650 mg Oral Given 11/30/19 2131)    ED Course  I have reviewed the triage vital signs and the nursing notes.  Pertinent labs & imaging results that were available during my care of the patient were reviewed by me and considered in my medical decision making (see chart for details).    MDM Rules/Calculators/A&P                          Reola CalkinsShirley Galiano is a 84 y.o. female here presenting with stable shortness of breath and right femoral DVT.  Patient symptoms going on for the last several months.  Patient already has a heart catheterization that showed nonobstructive disease.  Plan to get troponin x1, BNP, CTA chest.  11:01 PM CTA did not show any PE.  Patient has a indeterminate compression fracture and patient has no saddle anesthesia on my spinal exam.  Patient walks with a walker at baseline and has not changed.  Consulted pharmacy to counsel  her regarding Xarelto.  Will start on Xarelto starter pack and have her follow-up with her doctor.  Final Clinical Impression(s) / ED Diagnoses Final diagnoses:  None    Rx / DC Orders ED Discharge Orders    None       Charlynne Pander, MD 11/30/19 2302

## 2019-11-30 NOTE — ED Notes (Signed)
Pt given verbal and written d/c instructions

## 2019-11-30 NOTE — Discharge Instructions (Addendum)
Take xarelto starter pack as prescribed.   You have some compression fractures of your spine that are likely old.  Please follow-up with your doctor.  You need to see your doctor in a week for follow-up.  Return to ER if you have worse chest pain, shortness of breath, leg swelling.    Information on my medicine - XARELTO (rivaroxaban)  This medication education was reviewed with me or my healthcare representative as part of my discharge preparation.  The pharmacist that spoke with me during my hospital stay was:  Sampson Si, [facility]  WHY WAS XARELTO PRESCRIBED FOR YOU? Xarelto was prescribed to treat blood clots that may have been found in the veins of your legs (deep vein thrombosis) or in your lungs (pulmonary embolism) and to reduce the risk of them occurring again.  What do you need to know about Xarelto? The starting dose is one 15 mg tablet taken TWICE daily with food for the FIRST 21 DAYS then on (enter date)  12/22/19  the dose is changed to one 20 mg tablet taken ONCE A DAY with your evening meal.  DO NOT stop taking Xarelto without talking to the health care provider who prescribed the medication.  Refill your prescription for 20 mg tablets before you run out.  After discharge, you should have regular check-up appointments with your healthcare provider that is prescribing your Xarelto.  In the future your dose may need to be changed if your kidney function changes by a significant amount.  What do you do if you miss a dose? If you are taking Xarelto TWICE DAILY and you miss a dose, take it as soon as you remember. You may take two 15 mg tablets (total 30 mg) at the same time then resume your regularly scheduled 15 mg twice daily the next day.  If you are taking Xarelto ONCE DAILY and you miss a dose, take it as soon as you remember on the same day then continue your regularly scheduled once daily regimen the next day. Do not take two doses of Xarelto at the same  time.   Important Safety Information Xarelto is a blood thinner medicine that can cause bleeding. You should call your healthcare provider right away if you experience any of the following: Bleeding from an injury or your nose that does not stop. Unusual colored urine (red or dark brown) or unusual colored stools (red or black). Unusual bruising for unknown reasons. A serious fall or if you hit your head (even if there is no bleeding).  Some medicines may interact with Xarelto and might increase your risk of bleeding while on Xarelto. To help avoid this, consult your healthcare provider or pharmacist prior to using any new prescription or non-prescription medications, including herbals, vitamins, non-steroidal anti-inflammatory drugs (NSAIDs) and supplements.  This website has more information on Xarelto: VisitDestination.com.br.

## 2019-11-30 NOTE — Progress Notes (Signed)
Lower extremity venous has been completed.   Preliminary results in CV Proc.   Blanch Media 11/30/2019 2:57 PM

## 2019-11-30 NOTE — ED Triage Notes (Signed)
Pt states pt has a DVT on her right leg sent here by pcp for Chest CT for possible PE, pt denies any CP or SOB at this time. Family states pt just had lab work a week ago.

## 2019-12-07 ENCOUNTER — Telehealth: Payer: Self-pay | Admitting: Family Medicine

## 2019-12-07 NOTE — Telephone Encounter (Signed)
Made aware of provider feedback and voiced understanding.

## 2019-12-07 NOTE — Telephone Encounter (Signed)
Normally. I would say yes but they did not check her thyroid and she is on synthroid

## 2019-12-07 NOTE — Telephone Encounter (Signed)
I can't see where they wanted repeat labs from the ED but wasn't sure if you would want other labs drawn. Please advise.

## 2019-12-08 ENCOUNTER — Other Ambulatory Visit: Payer: Medicare HMO

## 2019-12-08 ENCOUNTER — Other Ambulatory Visit: Payer: Self-pay

## 2019-12-08 ENCOUNTER — Other Ambulatory Visit: Payer: Self-pay | Admitting: Family Medicine

## 2019-12-08 DIAGNOSIS — E039 Hypothyroidism, unspecified: Secondary | ICD-10-CM

## 2019-12-08 DIAGNOSIS — R7309 Other abnormal glucose: Secondary | ICD-10-CM

## 2019-12-08 LAB — BAYER DCA HB A1C WAIVED: HB A1C (BAYER DCA - WAIVED): 5.4 % (ref <7.0)

## 2019-12-09 LAB — THYROID PANEL WITH TSH
Free Thyroxine Index: 2.4 (ref 1.2–4.9)
T3 Uptake Ratio: 31 % (ref 24–39)
T4, Total: 7.8 ug/dL (ref 4.5–12.0)
TSH: 5.48 u[IU]/mL — ABNORMAL HIGH (ref 0.450–4.500)

## 2019-12-12 ENCOUNTER — Ambulatory Visit (INDEPENDENT_AMBULATORY_CARE_PROVIDER_SITE_OTHER): Payer: Medicare HMO | Admitting: Family Medicine

## 2019-12-12 ENCOUNTER — Other Ambulatory Visit: Payer: Self-pay

## 2019-12-12 ENCOUNTER — Encounter: Payer: Self-pay | Admitting: Family Medicine

## 2019-12-12 VITALS — BP 113/69 | HR 91 | Temp 98.2°F | Ht 62.0 in | Wt 186.0 lb

## 2019-12-12 DIAGNOSIS — I82411 Acute embolism and thrombosis of right femoral vein: Secondary | ICD-10-CM | POA: Diagnosis not present

## 2019-12-12 DIAGNOSIS — I10 Essential (primary) hypertension: Secondary | ICD-10-CM

## 2019-12-12 DIAGNOSIS — Z7901 Long term (current) use of anticoagulants: Secondary | ICD-10-CM

## 2019-12-12 DIAGNOSIS — E039 Hypothyroidism, unspecified: Secondary | ICD-10-CM

## 2019-12-12 DIAGNOSIS — R58 Hemorrhage, not elsewhere classified: Secondary | ICD-10-CM | POA: Diagnosis not present

## 2019-12-12 DIAGNOSIS — Z7689 Persons encountering health services in other specified circumstances: Secondary | ICD-10-CM | POA: Diagnosis not present

## 2019-12-12 LAB — HEMOGLOBIN, FINGERSTICK: Hemoglobin: 10.2 g/dL — ABNORMAL LOW (ref 11.1–15.9)

## 2019-12-12 MED ORDER — ESCITALOPRAM OXALATE 5 MG PO TABS: 5.0000 mg | ORAL_TABLET | Freq: Every day | ORAL | 3 refills | Status: DC

## 2019-12-12 MED ORDER — LOSARTAN POTASSIUM 25 MG PO TABS: 12.5000 mg | ORAL_TABLET | Freq: Every day | ORAL | 1 refills | Status: DC

## 2019-12-12 MED ORDER — RIVAROXABAN 20 MG PO TABS: 20.0000 mg | ORAL_TABLET | Freq: Every day | ORAL | 4 refills | Status: DC

## 2019-12-12 NOTE — Progress Notes (Signed)
Subjective: CC: est care, DVT, HTN PCP: Raliegh Ip, DO FHL:[id number] Ogas is a 84 y.o. female who is accompanied today by her daughter.  She is presenting to clinic today for:  1. Acute DVT Patient found to have a DVT 11/30/2019 in her right femoral vein.  This was thought to be caused by immobility secondary to back surgery prior to the event.  She is never had history of DVT.  No known family history of clotting disorder.  Does not report any hematochezia, melena, hematuria or vaginal bleeding but she has had quite a bit of bruising on her arms and chest.  She reports the swelling in the right lower extremity is getting better but is still present.  She is always had chronic bilateral lower extremity edema but this was certainly much worse than her normal.  She is ambulating more easily.  When she was seen in June she was wheelchair-bound but now she is able to get around with a rolling walker.  She is trying to keep her legs elevated to improve swelling.  She does note some burning pain in the right lower extremity.  She has gabapentin on hand but is not currently using it.  2.  Hypertension Patient reports compliance with lisinopril 10 mg daily and Lasix 80 mg daily.  She does report intermittent cough which is nagging.  She would like to try losartan, which her daughter is on.  No chest pain.  She has edema as above.   ROS: Per HPI  Allergies  Allergen Reactions   Tape Other (See Comments)    SKIN IS VERY THIN AND TEARS/BLEEDS VERY EASILY!!!!   Trazodone And Nefazodone Nausea And Vomiting   Mesalamine Rash   Past Medical History:  Diagnosis Date   Arthritis    Depression    DVT (deep venous thrombosis) (HCC)    GERD (gastroesophageal reflux disease)    Heart disease    Hyperlipidemia    Hypertension    Insomnia    Lymphocytic colitis    Spinal stenosis, lumbar    Thyroid disease     Current Outpatient Medications:    acetaminophen (TYLENOL) 650  MG CR tablet, Take 1,300 mg by mouth in the morning., Disp: , Rfl:    aspirin EC 81 MG tablet, Take 1 tablet (81 mg total) by mouth daily. Swallow whole., Disp: 90 tablet, Rfl: 3   escitalopram (LEXAPRO) 5 MG tablet, Take 1 tablet (5 mg total) by mouth daily., Disp: 30 tablet, Rfl: 0   furosemide (LASIX) 40 MG tablet, Take 80 mg by mouth daily., Disp: , Rfl:    gabapentin (NEURONTIN) 100 MG capsule, Take 100 mg by mouth daily as needed (pain). , Disp: , Rfl:    levothyroxine (SYNTHROID) 25 MCG tablet, Take 25 mcg by mouth daily before breakfast., Disp: , Rfl:    lisinopril (ZESTRIL) 10 MG tablet, Take 10 mg by mouth daily., Disp: , Rfl:    Multiple Vitamins-Minerals (PRESERVISION AREDS PO), Take 1 capsule by mouth daily. , Disp: , Rfl:    nitroGLYCERIN (NITROSTAT) 0.4 MG SL tablet, Place 1 tablet (0.4 mg total) under the tongue every 5 (five) minutes as needed. (Patient taking differently: Place 0.4 mg under the tongue every 5 (five) minutes as needed for chest pain. ), Disp: 25 tablet, Rfl: 6   potassium chloride SA (KLOR-CON) 20 MEQ tablet, Take 20 mEq by mouth daily., Disp: , Rfl:    RIVAROXABAN (XARELTO) VTE STARTER PACK (15 & 20 MG  TABLETS), Follow package directions: Take one 15mg  tablet by mouth twice a day. On day 22, switch to one 20mg  tablet once a day. Take with food., Disp: 51 each, Rfl: 0 Social History   Socioeconomic History   Marital status: Widowed    Spouse name: Not on file   Number of children: 4   Years of education: Not on file   Highest education level: Not on file  Occupational History   Not on file  Tobacco Use   Smoking status: Never Smoker   Smokeless tobacco: Never Used  Vaping Use   Vaping Use: Never used  Substance and Sexual Activity   Alcohol use: No   Drug use: No   Sexual activity: Not on file  Other Topics Concern   Not on file  Social History Narrative   Lives at home - in studio apartment at daughters house   Social  Determinants of Health   Financial Resource Strain:    Difficulty of Paying Living Expenses:   Food Insecurity:    Worried About in the Last Year:    in the Last Year:   Transportation Needs:    Programme researcher, broadcasting/film/video (Medical):    Lack of Transportation (Non-Medical):   Physical Activity:    Days of Exercise per Week:    Minutes of Exercise per Session:   Stress:    Feeling of Stress :   Social Connections:    Frequency of Communication with Friends and Family:    Frequency of Social Gatherings with Friends and Family:    Attends Religious Services:    Active Member of Clubs or Organizations:    Attends Barista:    Marital Status:   Intimate Partner Violence:    Fear of Current or Ex-Partner:    Emotionally Abused:    Physically Abused:    Sexually Abused:    Family History  Problem Relation Age of Onset   Heart attack Mother        68   Diabetes Mother    Other Father        black lung    Heart disease Father    Heart disease Sister        stents    Heart disease Brother    Other Brother        parkinson    COPD Daughter    Diabetes Daughter    Heart disease Daughter    Hypertension Daughter    Hyperlipidemia Daughter    Thyroid disease Daughter    COPD Son    Heart disease Son    Hypertension Son    Hyperlipidemia Son    Arthritis Son        rheum    AAA (abdominal aortic aneurysm) Brother    COPD Daughter    Heart disease Daughter    Hypertension Daughter    Thyroid disease Daughter    Arthritis Daughter        rheum    Heart disease Daughter    Heart attack Daughter    Hyperlipidemia Daughter    Thyroid disease Daughter     Objective: Office vital signs reviewed. BP 113/69    Pulse 91    Temp 98.2 F (36.8 C)    Ht 5\' 2"  (1.575 m)    Wt 186 lb (84.4 kg)    SpO2 97%    BMI 34.02 kg/m   Physical Examination:  General: Awake,  alert, No  acute distress HEENT: Normal, sclera white, MMM Cardio: regular rate and rhythm, S1S2 heard, no murmurs appreciated Pulm: clear to auscultation bilaterally, no wheezes, rhonchi or rales; normal work of breathing on room air Extremities: warm, well perfused, 2-3+ pitting edema of RLE and 1-2+ pitting edema of LLE, No cyanosis or clubbing; +2 pulses bilaterally MSK: slow gait and hunched station; uses rolling walker for ambulation Skin: Ecchymosis and petechial bleeds noted throughout bilateral upper extremities  Assessment/ Plan: 84 y.o. female   1. Acute deep vein thrombosis (DVT) of femoral vein of right lower extremity (HCC) She has 1 more week of the high-dose Xarelto.  I am going to have her come back in about 2 weeks for repeat hemoglobin check since there was about a 1.5 drop from her discharge to a couple weeks ago.  I have sent in the transition dose of Xarelto 20 mg daily.  2. Establishing care with new doctor, encounter for  3. Acquired hypothyroidism She had a slight elevation in TSH but I think this is likely in the setting of acute illness.  I will recheck this level in about 6 weeks  4. Ecchymosis Hemoglobin noted to be 10.2 today.  Her hemoglobin baseline appears to be somewhere between 11.3-12.2. - Hemoglobin, fingerstick  5. On anticoagulant therapy - Hemoglobin, fingerstick - rivaroxaban (XARELTO) 20 MG TABS tablet; Take 1 tablet (20 mg total) by mouth daily with supper.  Dispense: 30 tablet; Refill: 4  6. Essential hypertension Change lisinopril to losartan given cough.  We discussed blood pressure monitoring and parameters for dose adjustment. - losartan (COZAAR) 25 MG tablet; Take 0.5-1 tablets (12.5-25 mg total) by mouth daily.  Dispense: 30 tablet; Refill: 1   No orders of the defined types were placed in this encounter.  No orders of the defined types were placed in this encounter.    Raliegh Ip, DO [facility] [phone]

## 2019-12-12 NOTE — Patient Instructions (Addendum)
Monitor BP. Start with Losartan 12.5mg  (1/2 tablet) daily. If BP >140/90, increase to 25mg  (1 full tablet) daily. If BP <110/60, call me.  Vitamin D 800IU daily

## 2019-12-19 ENCOUNTER — Emergency Department (HOSPITAL_COMMUNITY)
Admission: EM | Admit: 2019-12-19 | Discharge: 2019-12-20 | Disposition: A | Discharge status: Home / Self Care | Payer: Medicare HMO | Attending: Emergency Medicine | Admitting: Emergency Medicine

## 2019-12-19 ENCOUNTER — Other Ambulatory Visit: Payer: Self-pay

## 2019-12-19 ENCOUNTER — Encounter (HOSPITAL_COMMUNITY): Payer: Self-pay | Admitting: Emergency Medicine

## 2019-12-19 DIAGNOSIS — Z7982 Long term (current) use of aspirin: Secondary | ICD-10-CM | POA: Diagnosis not present

## 2019-12-19 DIAGNOSIS — S8011XA Contusion of right lower leg, initial encounter: Secondary | ICD-10-CM | POA: Diagnosis not present

## 2019-12-19 DIAGNOSIS — I10 Essential (primary) hypertension: Secondary | ICD-10-CM | POA: Insufficient documentation

## 2019-12-19 DIAGNOSIS — Y939 Activity, unspecified: Secondary | ICD-10-CM | POA: Diagnosis not present

## 2019-12-19 DIAGNOSIS — Z79899 Other long term (current) drug therapy: Secondary | ICD-10-CM | POA: Insufficient documentation

## 2019-12-19 DIAGNOSIS — X58XXXA Exposure to other specified factors, initial encounter: Secondary | ICD-10-CM | POA: Diagnosis not present

## 2019-12-19 DIAGNOSIS — Y929 Unspecified place or not applicable: Secondary | ICD-10-CM | POA: Diagnosis not present

## 2019-12-19 DIAGNOSIS — E669 Obesity, unspecified: Secondary | ICD-10-CM | POA: Insufficient documentation

## 2019-12-19 DIAGNOSIS — Y999 Unspecified external cause status: Secondary | ICD-10-CM | POA: Insufficient documentation

## 2019-12-19 NOTE — ED Triage Notes (Signed)
Pt c/o right lower leg swelling and weeping x 2 days. Pt states she has history of dvt.

## 2019-12-20 NOTE — Discharge Instructions (Addendum)
Elevate your leg.  You can take an extra dose of your Lasix until the swelling gets better.  You may see some bruising down below your ankle near the bottom of your foot due to gravity, this is nothing to be concerned about.  Your Xarelto is working and preventing your blood from clotting.  Recheck if you get fever or chills, the skin in your leg feels hot to touch, or you start draining pus.

## 2019-12-20 NOTE — ED Notes (Signed)
Pt received discharge instructions, refused signature and ambulatory upon discharge.

## 2019-12-20 NOTE — ED Provider Notes (Signed)
[facility] EMERGENCY DEPARTMENT Provider Note   CSN: [id number] Arrival date & time: 12/19/19  1907   Time seen 12:30 AM  History Chief Complaint  Patient presents with  . Leg Pain    Kelsey Mills is a 84 y.o. female.  HPI   Patient states she has had swelling in her lower extremities for about 3 months.  She had a Doppler ultrasound done about 3 weeks ago that showed DVT in her right lower extremity.  She also had it done on her left leg which did not show DVT.  She has been on Xarelto twice a day.  She states a few days ago she was walking with the cane in her right hand and somehow she hit the medial aspect of her right lower leg with a cane.  She has noted bruising in that area that seems to be getting more purplish in color.  She states also she has been having some clear drainage from her legs.  She denies any fever.  She denies chest pain or shortness of breath.  She came in tonight because the bruising was getting bigger.  PCP Raliegh Ip, DO   Past Medical History:  Diagnosis Date  . Arthritis   . Depression   . DVT (deep venous thrombosis) (HCC)   . GERD (gastroesophageal reflux disease)   . Heart disease   . Hyperlipidemia   . Hypertension   . Insomnia   . Lymphocytic colitis   . Spinal stenosis, lumbar   . Thyroid disease     Patient Active Problem List   Diagnosis Date Noted  . Essential hypertension 11/22/2019  . Pedal edema 11/22/2019  . Angina pectoris (HCC) 11/22/2019  . Generalized edema 11/09/2019  . Depression with anxiety 11/09/2019  . Weight gain 01/16/2014  . Arthritis 01/12/2013  . Unspecified essential hypertension 01/12/2013  . Noninfectious gastroenteritis and colitis 01/12/2013  . Insomnia 01/12/2013    Past Surgical History:  Procedure Laterality Date  . CHOLECYSTECTOMY    . LEFT HEART CATH AND CORONARY ANGIOGRAPHY N/A 11/23/2019   Procedure: LEFT HEART CATH AND CORONARY ANGIOGRAPHY;  Surgeon: Runell Gess, MD;   Location: [facility] INVASIVE CV LAB;  Service: Cardiovascular;  Laterality: N/A;  . SPINE SURGERY     2 surgeries      OB History   No obstetric history on file.     Family History  Problem Relation Age of Onset  . Heart attack Mother        63  . Diabetes Mother   . Other Father        black lung   . Heart disease Father   . Heart disease Sister        stents   . Heart disease Brother   . Other Brother        parkinson   . COPD Daughter   . Diabetes Daughter   . Heart disease Daughter   . Hypertension Daughter   . Hyperlipidemia Daughter   . Thyroid disease Daughter   . COPD Son   . Heart disease Son   . Hypertension Son   . Hyperlipidemia Son   . Arthritis Son        rheum   . AAA (abdominal aortic aneurysm) Brother   . COPD Daughter   . Heart disease Daughter   . Hypertension Daughter   . Thyroid disease Daughter   . Arthritis Daughter        rheum   .  Heart disease Daughter   . Heart attack Daughter   . Hyperlipidemia Daughter   . Thyroid disease Daughter     Social History   Tobacco Use  . Smoking status: Never Smoker  . Smokeless tobacco: Never Used  Vaping Use  . Vaping Use: Never used  Substance Use Topics  . Alcohol use: No  . Drug use: No    Home Medications Prior to Admission medications   Medication Sig Start Date End Date Taking? Authorizing Provider  acetaminophen (TYLENOL) 650 MG CR tablet Take 1,300 mg by mouth in the morning.    [provider]  aspirin EC 81 MG tablet Take 1 tablet (81 mg total) by mouth daily. Swallow whole. 11/22/19   Revankar, Aundra Dubin, MD  escitalopram (LEXAPRO) 5 MG tablet Take 1 tablet (5 mg total) by mouth daily. 12/12/19   Raliegh Ip, DO  furosemide (LASIX) 40 MG tablet Take 80 mg by mouth daily.    [provider]  gabapentin (NEURONTIN) 100 MG capsule Take 100 mg by mouth daily as needed (pain).     [provider]  levothyroxine (SYNTHROID) 25 MCG tablet Take 25 mcg by mouth  daily before breakfast.    [provider]  losartan (COZAAR) 25 MG tablet Take 0.5-1 tablets (12.5-25 mg total) by mouth daily. 12/12/19   Raliegh Ip, DO  Multiple Vitamins-Minerals (PRESERVISION AREDS PO) Take 1 capsule by mouth daily.     [provider]  nitroGLYCERIN (NITROSTAT) 0.4 MG SL tablet Place 1 tablet (0.4 mg total) under the tongue every 5 (five) minutes as needed. Patient taking differently: Place 0.4 mg under the tongue every 5 (five) minutes as needed for chest pain.  11/22/19 02/20/20  Revankar, Aundra Dubin, MD  potassium chloride SA (KLOR-CON) 20 MEQ tablet Take 20 mEq by mouth daily.    [provider]  rivaroxaban (XARELTO) 20 MG TABS tablet Take 1 tablet (20 mg total) by mouth daily with supper. 12/12/19   Raliegh Ip, DO  RIVAROXABAN Carlena Hurl) VTE STARTER PACK (15 & 20 MG TABLETS) Follow package directions: Take one 15mg  tablet by mouth twice a day. On day 22, switch to one 20mg  tablet once a day. Take with food. 11/30/19   , MD    Allergies    Tape, Trazodone and nefazodone, and Mesalamine  Review of Systems   Review of Systems  All other systems reviewed and are negative.   Physical Exam Updated Vital Signs BP (!) 115/63   Pulse 88   Temp 98.3 F (36.8 C) (Oral)   Resp 18   Ht 5\' 2"  (1.575 m)   Wt 82.6 kg   SpO2 98%   BMI 33.29 kg/m   Physical Exam Vitals and nursing note reviewed.  Constitutional:      Appearance: Normal appearance. She is obese.  HENT:     Head: Normocephalic and atraumatic.     Right Ear: External ear normal.     Left Ear: External ear normal.  Eyes:     Extraocular Movements: Extraocular movements intact.     Conjunctiva/sclera: Conjunctivae normal.  Cardiovascular:     Pulses: Normal pulses.  Pulmonary:     Effort: Pulmonary effort is normal. No respiratory distress.  Musculoskeletal:        General: Swelling present.  Skin:    General: Skin is warm and dry.      Comments: Patient is noted to have swelling of both lower extremities.  When  I examine her legs the skin is cool to touch in both legs from her knees down to her toes.  There is no warmth felt she is noted to have some bruising of her medial aspect of her lower leg.  I do not see any obvious weeping at this time and there is no obvious weeping seen to the Chux under her leg.  Neurological:     Mental Status: She is alert.  Psychiatric:        Mood and Affect: Mood normal.        Behavior: Behavior normal.        Thought Content: Thought content normal.         ED Results / Procedures / Treatments   Labs (all labs ordered are listed, but only abnormal results are displayed) Labs Reviewed - No data to display  EKG None  Radiology No results found.  Procedures Procedures (including critical care time)  Medications Ordered in ED Medications - No data to display  ED Course  I have reviewed the triage vital signs and the nursing notes.  Pertinent labs & imaging results that were available during my care of the patient were reviewed by me and considered in my medical decision making (see chart for details).    MDM Rules/Calculators/A&P                          I few will sure that she is not having cellulitis.  There is no warmth to her skin at all.  She is also not draining pus from her wounds.  She has had no fever or chills.  I also am not concerned about worsening DVT because she is on Xarelto and she has no chest pain or shortness of breath.  She was reassured that this is typical bruising after Xarelto.  She needs to elevate her legs more and she is already on Lasix.  She was advised to take an extra dose for the next couple days to get the swelling down.  She should be rechecked if she gets fever or chills or the skin starts feeling hot to touch.  I did discuss with him that due to gravity that she could see some bruising settling between her ankle and her sole of her foot and  that would be nothing to be concerned about.    Final Clinical Impression(s) / ED Diagnoses Final diagnoses:  Hematoma of right lower extremity, initial encounter    Rx / DC Orders ED Discharge Orders    None     Plan discharge  Devoria Albe, MD, Concha Pyo, MD 12/20/19 [phone]

## 2019-12-22 ENCOUNTER — Other Ambulatory Visit: Payer: Self-pay

## 2019-12-22 ENCOUNTER — Ambulatory Visit (INDEPENDENT_AMBULATORY_CARE_PROVIDER_SITE_OTHER): Payer: Medicare HMO | Admitting: Cardiology

## 2019-12-22 ENCOUNTER — Encounter: Payer: Self-pay | Admitting: Cardiology

## 2019-12-22 VITALS — BP 112/60 | HR 78 | Ht 62.0 in | Wt 188.1 lb

## 2019-12-22 DIAGNOSIS — I2510 Atherosclerotic heart disease of native coronary artery without angina pectoris: Secondary | ICD-10-CM

## 2019-12-22 DIAGNOSIS — I209 Angina pectoris, unspecified: Secondary | ICD-10-CM | POA: Diagnosis not present

## 2019-12-22 DIAGNOSIS — R600 Localized edema: Secondary | ICD-10-CM

## 2019-12-22 DIAGNOSIS — I10 Essential (primary) hypertension: Secondary | ICD-10-CM

## 2019-12-22 HISTORY — DX: Atherosclerotic heart disease of native coronary artery without angina pectoris: I25.10

## 2019-12-22 NOTE — Patient Instructions (Signed)
Medication Instructions:  Your physician recommends that you continue on your current medications as directed. Please refer to the Current Medication list given to you today.  *If you need a refill on your cardiac medications before your next appointment, please call your pharmacy*   Lab Work: TODAY:  BMET  If you have labs (blood work) drawn today and your tests are completely normal, you will receive your results only by: Marland Kitchen MyChart Message (if you have MyChart) OR . A paper copy in the mail If you have any lab test that is abnormal or we need to change your treatment, we will call you to review the results.   Testing/Procedures: None ordered   Follow-Up: At [facility], you and your health needs are our priority.  As part of our continuing mission to provide you with exceptional heart care, we have created designated Provider Care Teams.  These Care Teams include your primary Cardiologist (physician) and Advanced Practice Providers (APPs -  Physician Assistants and Nurse Practitioners) who all work together to provide you with the care you need, when you need it.  We recommend signing up for the patient portal called "MyChart".  Sign up information is provided on this After Visit Summary.  MyChart is used to connect with patients for Virtual Visits (Telemedicine).  Patients are able to view lab/test results, encounter notes, upcoming appointments, etc.  Non-urgent messages can be sent to your provider as well.   To learn more about what you can do with MyChart, go to [email or web address].    Your next appointment:   01/19/2020 ARRIVE AT 1:05 FOR REGISTRATION  The format for your next appointment:   In Person  Provider:   Belva Crome, MD   Other Instructions

## 2019-12-22 NOTE — Progress Notes (Signed)
Cardiology Office Note:    Date:  12/22/2019   ID:  [name], DOB 1936/04/15, MRN [id number]  PCP:  Raliegh Ip, DO  Cardiologist:  Garwin Brothers, MD   Referring MD: Raliegh Ip, DO    ASSESSMENT:    No diagnosis found. PLAN:    In order of problems listed above:  1. Coronary artery disease: Secondary prevention stressed with the patient.  Importance of compliance with diet medication stressed and she vocalized understanding.  Coronary angiography report was discussed with the patient at length.  She has a lot of ecchymosis so she is not keen on adding aspirin to her regimen.  She is on anticoagulation. 2. DVT: Marked DVT visualized in the right femoropopliteal system as discussed below.  Patient on anticoagulation and will continue with this.  The patient went to the emergency room for right lower extremity swelling after the diagnosis and after being on Xarelto and was evaluated and released.  Clinically today I examined the patient there is no evidence of any inflammation such as any significant redness or increased warmth.  She is to be on the safer side I will add a CBC to her regimen. 3. Obesity and lipid evaluation: I will do this when I see her in follow-up appointment in a month.  Patient is on multiple medications and is trying to get better.  She wants more time before add more medications.  I will assess her lipids when I see her in the next visit.Patient will be seen in follow-up appointment in 6 months or earlier if the patient has any concerns    Medication Adjustments/Labs and Tests Ordered: Current medicines are reviewed at length with the patient today.  Concerns regarding medicines are outlined above.  No orders of the defined types were placed in this encounter.  No orders of the defined types were placed in this encounter.    No chief complaint on file.    History of Present Illness:    Kelsey Mills is a 84 y.o. female.   Patient was evaluated by me for angina and right leg swelling.  I sent her for a DVT study which revealed marked amount of clot burden in the right lower extremity femoral popliteal venous system.  She was initiated on anticoagulation.  Also coronary angiography revealed total occlusion of the right coronary artery.  She overall leads a sedentary lifestyle.  No chest pain orthopnea or PND.  At the time of my evaluation, the patient is alert awake oriented and in no distress.  Past Medical History:  Diagnosis Date  . Arthritis   . Depression   . DVT (deep venous thrombosis) (HCC)   . GERD (gastroesophageal reflux disease)   . Heart disease   . Hyperlipidemia   . Hypertension   . Insomnia   . Lymphocytic colitis   . Spinal stenosis, lumbar   . Thyroid disease     Past Surgical History:  Procedure Laterality Date  . CHOLECYSTECTOMY    . LEFT HEART CATH AND CORONARY ANGIOGRAPHY N/A 11/23/2019   Procedure: LEFT HEART CATH AND CORONARY ANGIOGRAPHY;  Surgeon: Runell Gess, MD;  Location: [facility];  Service: Cardiovascular;  Laterality: N/A;  . SPINE SURGERY     2 surgeries     Current Medications: Current Meds  Medication Sig  . acetaminophen (TYLENOL) 650 MG CR tablet Take 1,300 mg by mouth in the morning.  Marland Kitchen aspirin EC 81 MG tablet Take 1 tablet (81 mg  total) by mouth daily. Swallow whole.  . escitalopram (LEXAPRO) 5 MG tablet Take 1 tablet (5 mg total) by mouth daily.  . furosemide (LASIX) 40 MG tablet Take 80 mg by mouth daily.  Marland Kitchen gabapentin (NEURONTIN) 100 MG capsule Take 100 mg by mouth daily as needed (pain).   Marland Kitchen levothyroxine (SYNTHROID) 25 MCG tablet Take 25 mcg by mouth daily before breakfast.  . losartan (COZAAR) 25 MG tablet Take 0.5-1 tablets (12.5-25 mg total) by mouth daily.  . Multiple Vitamins-Minerals (PRESERVISION AREDS PO) Take 1 capsule by mouth daily.   . nitroGLYCERIN (NITROSTAT) 0.4 MG SL tablet Place 1 tablet (0.4 mg total) under the tongue every 5  (five) minutes as needed.  . potassium chloride SA (KLOR-CON) 20 MEQ tablet Take 20 mEq by mouth daily.  . rivaroxaban (XARELTO) 20 MG TABS tablet Take 1 tablet (20 mg total) by mouth daily with supper.  Marland Kitchen RIVAROXABAN (XARELTO) VTE STARTER PACK (15 & 20 MG TABLETS) Follow package directions: Take one 15mg  tablet by mouth twice a day. On day 22, switch to one 20mg  tablet once a day. Take with food.     Allergies:   Tape, Trazodone and nefazodone, and Mesalamine   Social History   Socioeconomic History  . Marital status: Widowed    Spouse name: Not on file  . Number of children: 4  . Years of education: Not on file  . Highest education level: Not on file  Occupational History  . Not on file  Tobacco Use  . Smoking status: Never Smoker  . Smokeless tobacco: Never Used  Vaping Use  . Vaping Use: Never used  Substance and Sexual Activity  . Alcohol use: No  . Drug use: No  . Sexual activity: Not on file  Other Topics Concern  . Not on file  Social History Narrative   Lives at home - in studio apartment at daughters house   Social Determinants of Health   Financial Resource Strain:   . Difficulty of Paying Living Expenses:   Food Insecurity:   . Worried About in the Last Year:   . in the Last Year:   Transportation Needs:   . Programme researcher, broadcasting/film/video (Medical):   Barista Lack of Transportation (Non-Medical):   Physical Activity:   . Days of Exercise per Week:   . Minutes of Exercise per Session:   Stress:   . Feeling of Stress :   Social Connections:   . Frequency of Communication with Friends and Family:   . Frequency of Social Gatherings with Friends and Family:   . Attends Religious Services:   . Active Member of Clubs or Organizations:   . Attends Freight forwarder Meetings:   Marland Kitchen Marital Status:      Family History: The patient's family history includes AAA (abdominal aortic aneurysm) in her brother; Arthritis in her daughter and  son; COPD in her daughter, daughter, and son; Diabetes in her daughter and mother; Heart attack in her daughter and mother; Heart disease in her brother, daughter, daughter, daughter, father, sister, and son; Hyperlipidemia in her daughter, daughter, and son; Hypertension in her daughter, daughter, and son; Other in her brother and father; Thyroid disease in her daughter, daughter, and daughter.  ROS:   Please see the history of present illness.    All other systems reviewed and are negative.  EKGs/Labs/Other Studies Reviewed:    The following studies were reviewed today: Banker, MD (  Primary)    Procedures  LEFT HEART CATH AND CORONARY ANGIOGRAPHY  Conclusion    3rd Mrg lesion is 50% stenosed.  Prox RCA to Mid RCA lesion is 100% stenosed.  The left ventricular systolic function is normal.  LV end diastolic pressure is normal.  The left ventricular ejection fraction is 55-65% by visual estimate.   Kelsey Mills is a 84 y.o. female    [id number] LOCATION:  FACILITY: [facility]  PHYSICIAN: Nanetta Batty, M.D. 27-Nov-1935   DATE OF PROCEDURE:  11/23/2019  DATE OF DISCHARGE:     CARDIAC CATHETERIZATION     History obtained from chart review.  Ms. Rauls is a an 84 year old Caucasian female referred by Dr. Tomie China for cardiac catheterization because of chest pain.  She has a history of hypertension, hyperlipidemia and family history of heart disease.   IMPRESSION:Ms Cuoco has an RCA CTO and a right dominant system with grade 1-2 left-to-right collaterals.  She has intermediate lesion and at mid circumflex marginal branch which did not appear to be significant and normal LV function.  She can be treated medically initially with antianginal medications leaving RCA CTO intervention for recalcitrant symptoms.  This has been communicated to her referring cardiologist.  The sheath was removed and a TR band was placed on the right wrist to achieve  patent hemostasis.  The patient left lab stable condition.  She will be discharged home later today as an outpatient and will follow up with Dr. Tomie China.  Nanetta Batty. MD, [facility] 11/23/2019 8:39 AM  Summary:  RIGHT:  - Findings consistent with age indeterminate deep vein thrombosis  involving the right common femoral vein, right femoral vein, right  proximal profunda vein, right popliteal vein, and right posterior tibial  veins.  - No cystic structure found in the popliteal fossa.    LEFT:  - No evidence of deep vein thrombosis in the lower extremity. No indirect  evidence of obstruction proximal to the inguinal ligament.  - No cystic structure found in the popliteal fossa.    *See table(s) above for measurements and observations.   Electronically signed by Lemar Livings MD on 11/30/2019 at 8:15:11 PM.    Recent Labs: 11/30/2019: ALT 23; B Natriuretic Peptide 66.7; BUN 22; Creatinine, Ser 0.90; Hemoglobin 12.2; Platelets 212; Potassium 3.6; Sodium 139 12/08/2019: TSH 5.480  Recent Lipid Panel No results found for: CHOL, TRIG, HDL, CHOLHDL, VLDL, LDLCALC, LDLDIRECT  Physical Exam:    VS:  There were no vitals taken for this visit.    Wt Readings from Last 3 Encounters:  12/19/19 182 lb (82.6 kg)  12/12/19 186 lb (84.4 kg)  11/30/19 184 lb (83.5 kg)     GEN: Patient is in no acute distress HEENT: Normal NECK: No JVD; No carotid bruits LYMPHATICS: No lymphadenopathy CARDIAC: Hear sounds regular, 2/6 systolic murmur at the apex. RESPIRATORY:  Clear to auscultation without rales, wheezing or rhonchi  ABDOMEN: Soft, non-tender, non-distended MUSCULOSKELETAL: Marked right extremity pedal edema and also edema on the left; No deformity  SKIN: Warm and dry NEUROLOGIC:  Alert and oriented x 3 PSYCHIATRIC:  Normal affect   Signed, Garwin Brothers, MD  12/22/2019 1:09 PM    [facility]

## 2019-12-23 LAB — BASIC METABOLIC PANEL
BUN/Creatinine Ratio: 26 (ref 12–28)
BUN: 28 mg/dL — ABNORMAL HIGH (ref 8–27)
CO2: 26 mmol/L (ref 20–29)
Calcium: 9.4 mg/dL (ref 8.7–10.3)
Chloride: 102 mmol/L (ref 96–106)
Creatinine, Ser: 1.08 mg/dL — ABNORMAL HIGH (ref 0.57–1.00)
GFR calc Af Amer: 56 mL/min/{1.73_m2} — ABNORMAL LOW (ref >59)
GFR calc non Af Amer: 48 mL/min/{1.73_m2} — ABNORMAL LOW (ref >59)
Glucose: 113 mg/dL — ABNORMAL HIGH (ref 65–99)
Potassium: 4.5 mmol/L (ref 3.5–5.2)
Sodium: 143 mmol/L (ref 134–144)

## 2019-12-23 LAB — CBC
Hematocrit: 34.4 % (ref 34.0–46.6)
Hemoglobin: 11.8 g/dL (ref 11.1–15.9)
MCH: 35.2 pg — ABNORMAL HIGH (ref 26.6–33.0)
MCHC: 34.3 g/dL (ref 31.5–35.7)
MCV: 103 fL — ABNORMAL HIGH (ref 79–97)
Platelets: 240 10*3/uL (ref 150–450)
RBC: 3.35 x10E6/uL — ABNORMAL LOW (ref 3.77–5.28)
RDW: 13.4 % (ref 11.7–15.4)
WBC: 9.4 10*3/uL (ref 3.4–10.8)

## 2019-12-26 ENCOUNTER — Other Ambulatory Visit: Payer: Self-pay

## 2019-12-26 ENCOUNTER — Other Ambulatory Visit: Payer: Medicare HMO

## 2019-12-26 DIAGNOSIS — R58 Hemorrhage, not elsewhere classified: Secondary | ICD-10-CM

## 2019-12-26 DIAGNOSIS — Z7901 Long term (current) use of anticoagulants: Secondary | ICD-10-CM

## 2019-12-26 LAB — CBC
Hematocrit: 35.3 % (ref 34.0–46.6)
Hemoglobin: 11.7 g/dL (ref 11.1–15.9)
MCH: 33.6 pg — ABNORMAL HIGH (ref 26.6–33.0)
MCHC: 33.1 g/dL (ref 31.5–35.7)
MCV: 101 fL — ABNORMAL HIGH (ref 79–97)
Platelets: 250 10*3/uL (ref 150–450)
RBC: 3.48 x10E6/uL — ABNORMAL LOW (ref 3.77–5.28)
RDW: 13.1 % (ref 11.7–15.4)
WBC: 9.7 10*3/uL (ref 3.4–10.8)

## 2020-01-19 ENCOUNTER — Ambulatory Visit (INDEPENDENT_AMBULATORY_CARE_PROVIDER_SITE_OTHER): Payer: Medicare HMO | Admitting: Cardiology

## 2020-01-19 ENCOUNTER — Encounter: Payer: Self-pay | Admitting: Cardiology

## 2020-01-19 ENCOUNTER — Other Ambulatory Visit: Payer: Self-pay

## 2020-01-19 ENCOUNTER — Ambulatory Visit: Payer: Medicare HMO | Admitting: Cardiology

## 2020-01-19 VITALS — BP 128/74 | HR 79 | Ht 62.0 in | Wt 189.1 lb

## 2020-01-19 DIAGNOSIS — I10 Essential (primary) hypertension: Secondary | ICD-10-CM | POA: Diagnosis not present

## 2020-01-19 DIAGNOSIS — I2510 Atherosclerotic heart disease of native coronary artery without angina pectoris: Secondary | ICD-10-CM

## 2020-01-19 DIAGNOSIS — E039 Hypothyroidism, unspecified: Secondary | ICD-10-CM

## 2020-01-19 NOTE — Progress Notes (Signed)
Cardiology Office Note:    Date:  01/19/2020   ID:  [name], DOB November 03, 1935, MRN [id number]  PCP:  Raliegh Ip, DO  Cardiologist:  Garwin Brothers, MD   Referring MD: Raliegh Ip, DO    ASSESSMENT:    1. Essential hypertension   2. Coronary artery disease involving native coronary artery of native heart without angina pectoris    PLAN:    In order of problems listed above:  1. Coronary artery disease: Primary prevention stressed with the patient.  Importance of compliance with diet medication stressed and she vocalized understanding.  She has been ambulating to the best of her ability. 2. History of DVT on anticoagulation: Benefits and potential is explained and she vocalized understanding. 3. Essential hypertension: Stable at this time. 4. Mixed dyslipidemia and overweight status: Diet was emphasized.  She will have all blood work today.  If this is fine but I will initiate her on a low-dose statin such as atorvastatin 10 mg daily and follow-up on her blood work. 5. Patient will be seen in follow-up appointment in 6 months or earlier if the patient has any concerns    Medication Adjustments/Labs and Tests Ordered: Current medicines are reviewed at length with the patient today.  Concerns regarding medicines are outlined above.  No orders of the defined types were placed in this encounter.  No orders of the defined types were placed in this encounter.    No chief complaint on file.    History of Present Illness:    Kelsey Mills is a 84 y.o. female.  Patient has past medical history of coronary artery disease by coronary angiography, essential hypertension obesity and dyslipidemia.  She denies any problems at this time and takes care of activities of daily living.  No chest pain orthopnea or PND.  She has had significant DVT for which she is treated with Xarelto.  At the time of my evaluation, the patient is alert awake oriented and in no  distress.  Past Medical History:  Diagnosis Date  . Arthritis   . Depression   . DVT (deep venous thrombosis) (HCC)   . GERD (gastroesophageal reflux disease)   . Heart disease   . Hyperlipidemia   . Hypertension   . Insomnia   . Lymphocytic colitis   . Spinal stenosis, lumbar   . Thyroid disease     Past Surgical History:  Procedure Laterality Date  . CHOLECYSTECTOMY    . LEFT HEART CATH AND CORONARY ANGIOGRAPHY N/A 11/23/2019   Procedure: LEFT HEART CATH AND CORONARY ANGIOGRAPHY;  Surgeon: Runell Gess, MD;  Location: [facility];  Service: Cardiovascular;  Laterality: N/A;  . SPINE SURGERY     2 surgeries     Current Medications: Current Meds  Medication Sig  . acetaminophen (TYLENOL) 650 MG CR tablet Take 1,300 mg by mouth in the morning.  Marland Kitchen aspirin EC 81 MG tablet Take 1 tablet (81 mg total) by mouth daily. Swallow whole.  . Cholecalciferol (VITAMIN D3) 20 MCG (800 UNIT) TABS Take 1 tablet by mouth daily. Except for Saturday and Sunday  . escitalopram (LEXAPRO) 5 MG tablet Take 1 tablet (5 mg total) by mouth daily.  . furosemide (LASIX) 40 MG tablet Take 80 mg by mouth daily.  Marland Kitchen gabapentin (NEURONTIN) 100 MG capsule Take 100 mg by mouth daily as needed (pain).   Marland Kitchen levothyroxine (SYNTHROID) 25 MCG tablet Take 25 mcg by mouth daily before breakfast.  . losartan (COZAAR)  25 MG tablet Take 0.5-1 tablets (12.5-25 mg total) by mouth daily.  . Multiple Vitamins-Minerals (PRESERVISION AREDS PO) Take 1 capsule by mouth daily.   . nitroGLYCERIN (NITROSTAT) 0.4 MG SL tablet Place 1 tablet (0.4 mg total) under the tongue every 5 (five) minutes as needed.  . potassium chloride SA (KLOR-CON) 20 MEQ tablet Take 20 mEq by mouth daily.  . rivaroxaban (XARELTO) 20 MG TABS tablet Take 1 tablet (20 mg total) by mouth daily with supper.     Allergies:   Tape, Trazodone and nefazodone, and Mesalamine   Social History   Socioeconomic History  . Marital status: Widowed     Spouse name: Not on file  . Number of children: 4  . Years of education: Not on file  . Highest education level: Not on file  Occupational History  . Not on file  Tobacco Use  . Smoking status: Never Smoker  . Smokeless tobacco: Never Used  Vaping Use  . Vaping Use: Never used  Substance and Sexual Activity  . Alcohol use: No  . Drug use: No  . Sexual activity: Not on file  Other Topics Concern  . Not on file  Social History Narrative   Lives at home - in studio apartment at daughters house   Social Determinants of Health   Financial Resource Strain:   . Difficulty of Paying Living Expenses: Not on file  Food Insecurity:   . Worried About Programme researcher, broadcasting/film/video in the Last Year: Not on file  . Ran Out of Food in the Last Year: Not on file  Transportation Needs:   . Lack of Transportation (Medical): Not on file  . Lack of Transportation (Non-Medical): Not on file  Physical Activity:   . Days of Exercise per Week: Not on file  . Minutes of Exercise per Session: Not on file  Stress:   . Feeling of Stress : Not on file  Social Connections:   . Frequency of Communication with Friends and Family: Not on file  . Frequency of Social Gatherings with Friends and Family: Not on file  . Attends Religious Services: Not on file  . Active Member of Clubs or Organizations: Not on file  . Attends Banker Meetings: Not on file  . Marital Status: Not on file     Family History: The patient's family history includes AAA (abdominal aortic aneurysm) in her brother; Arthritis in her daughter and son; COPD in her daughter, daughter, and son; Diabetes in her daughter and mother; Heart attack in her daughter and mother; Heart disease in her brother, daughter, daughter, daughter, father, sister, and son; Hyperlipidemia in her daughter, daughter, and son; Hypertension in her daughter, daughter, and son; Other in her brother and father; Thyroid disease in her daughter, daughter, and  daughter.  ROS:   Please see the history of present illness.    All other systems reviewed and are negative.  EKGs/Labs/Other Studies Reviewed:    The following studies were reviewed today: I discussed my findings with the patient at extensive length especially coronary geography report   Recent Labs: 11/30/2019: ALT 23; B Natriuretic Peptide 66.7 12/08/2019: TSH 5.480 12/22/2019: BUN 28; Creatinine, Ser 1.08; Potassium 4.5; Sodium 143 12/26/2019: Hemoglobin 11.7; Platelets 250  Recent Lipid Panel No results found for: CHOL, TRIG, HDL, CHOLHDL, VLDL, LDLCALC, LDLDIRECT  Physical Exam:    VS:  BP 128/74   Pulse 79   Ht 5\' 2"  (1.575 m)   Wt 189 lb 1.9  oz (85.8 kg)   SpO2 96%   BMI 34.59 kg/m     Wt Readings from Last 3 Encounters:  01/19/20 189 lb 1.9 oz (85.8 kg)  12/22/19 188 lb 1.3 oz (85.3 kg)  12/19/19 182 lb (82.6 kg)     GEN: Patient is in no acute distress HEENT: Normal NECK: No JVD; No carotid bruits LYMPHATICS: No lymphadenopathy CARDIAC: Hear sounds regular, 2/6 systolic murmur at the apex. RESPIRATORY:  Clear to auscultation without rales, wheezing or rhonchi  ABDOMEN: Soft, non-tender, non-distended MUSCULOSKELETAL:  No edema; No deformity  SKIN: Warm and dry NEUROLOGIC:  Alert and oriented x 3 PSYCHIATRIC:  Normal affect   Signed, Garwin Brothers, MD  01/19/2020 11:54 AM    [facility]

## 2020-01-19 NOTE — Patient Instructions (Signed)
Medication Instructions:  No medication changes. *If you need a refill on your cardiac medications before your next appointment, please call your pharmacy*   Lab Work: Your physician recommends that you have labs done in the office today. Your test included  basic metabolic panel, complete blood count and TSH.  If you have labs (blood work) drawn today and your tests are completely normal, you will receive your results only by: Marland Kitchen MyChart Message (if you have MyChart) OR . A paper copy in the mail If you have any lab test that is abnormal or we need to change your treatment, we will call you to review the results.   Testing/Procedures: None ordered   Follow-Up: At [facility], you and your health needs are our priority.  As part of our continuing mission to provide you with exceptional heart care, we have created designated Provider Care Teams.  These Care Teams include your primary Cardiologist (physician) and Advanced Practice Providers (APPs -  Physician Assistants and Nurse Practitioners) who all work together to provide you with the care you need, when you need it.  We recommend signing up for the patient portal called "MyChart".  Sign up information is provided on this After Visit Summary.  MyChart is used to connect with patients for Virtual Visits (Telemedicine).  Patients are able to view lab/test results, encounter notes, upcoming appointments, etc.  Non-urgent messages can be sent to your provider as well.   To learn more about what you can do with MyChart, go to [email or web address].    Your next appointment:   6 month(s)  The format for your next appointment:   In Person  Provider:   Belva Crome, MD   Other Instructions NA

## 2020-01-20 LAB — LIPID PANEL
Chol/HDL Ratio: 3.6 ratio (ref 0.0–4.4)
Cholesterol, Total: 177 mg/dL (ref 100–199)
HDL: 49 mg/dL (ref >39)
LDL Chol Calc (NIH): 109 mg/dL — ABNORMAL HIGH (ref 0–99)
Triglycerides: 103 mg/dL (ref 0–149)
VLDL Cholesterol Cal: 19 mg/dL (ref 5–40)

## 2020-01-20 LAB — BASIC METABOLIC PANEL
BUN/Creatinine Ratio: 17 (ref 12–28)
BUN: 14 mg/dL (ref 8–27)
CO2: 28 mmol/L (ref 20–29)
Calcium: 9 mg/dL (ref 8.7–10.3)
Chloride: 100 mmol/L (ref 96–106)
Creatinine, Ser: 0.81 mg/dL (ref 0.57–1.00)
GFR calc Af Amer: 79 mL/min/{1.73_m2} (ref >59)
GFR calc non Af Amer: 68 mL/min/{1.73_m2} (ref >59)
Glucose: 107 mg/dL — ABNORMAL HIGH (ref 65–99)
Potassium: 3.7 mmol/L (ref 3.5–5.2)
Sodium: 142 mmol/L (ref 134–144)

## 2020-01-20 LAB — CBC WITH DIFFERENTIAL/PLATELET
Basophils Absolute: 0.1 10*3/uL (ref 0.0–0.2)
Basos: 1 %
EOS (ABSOLUTE): 0.1 10*3/uL (ref 0.0–0.4)
Eos: 1 %
Hematocrit: 38.8 % (ref 34.0–46.6)
Hemoglobin: 12.8 g/dL (ref 11.1–15.9)
Immature Grans (Abs): 0.1 10*3/uL (ref 0.0–0.1)
Immature Granulocytes: 1 %
Lymphocytes Absolute: 1.8 10*3/uL (ref 0.7–3.1)
Lymphs: 21 %
MCH: 33.6 pg — ABNORMAL HIGH (ref 26.6–33.0)
MCHC: 33 g/dL (ref 31.5–35.7)
MCV: 102 fL — ABNORMAL HIGH (ref 79–97)
Monocytes Absolute: 0.8 10*3/uL (ref 0.1–0.9)
Monocytes: 9 %
Neutrophils Absolute: 5.7 10*3/uL (ref 1.4–7.0)
Neutrophils: 67 %
Platelets: 271 10*3/uL (ref 150–450)
RBC: 3.81 x10E6/uL (ref 3.77–5.28)
RDW: 12 % (ref 11.7–15.4)
WBC: 8.5 10*3/uL (ref 3.4–10.8)

## 2020-01-20 LAB — HEPATIC FUNCTION PANEL
ALT: 11 IU/L (ref 0–32)
AST: 13 IU/L (ref 0–40)
Albumin: 3.9 g/dL (ref 3.6–4.6)
Alkaline Phosphatase: 128 IU/L — ABNORMAL HIGH (ref 48–121)
Bilirubin Total: 0.5 mg/dL (ref 0.0–1.2)
Bilirubin, Direct: 0.19 mg/dL (ref 0.00–0.40)
Total Protein: 6.2 g/dL (ref 6.0–8.5)

## 2020-01-20 LAB — TSH: TSH: 2.84 u[IU]/mL (ref 0.450–4.500)

## 2020-01-23 ENCOUNTER — Other Ambulatory Visit: Payer: Self-pay

## 2020-01-23 ENCOUNTER — Encounter: Payer: Self-pay | Admitting: Family Medicine

## 2020-01-23 ENCOUNTER — Ambulatory Visit (INDEPENDENT_AMBULATORY_CARE_PROVIDER_SITE_OTHER): Payer: Medicare HMO | Admitting: Family Medicine

## 2020-01-23 VITALS — BP 140/81 | HR 97 | Temp 98.1°F | Ht 62.0 in | Wt 187.6 lb

## 2020-01-23 DIAGNOSIS — R238 Other skin changes: Secondary | ICD-10-CM

## 2020-01-23 DIAGNOSIS — Z7901 Long term (current) use of anticoagulants: Secondary | ICD-10-CM | POA: Diagnosis not present

## 2020-01-23 DIAGNOSIS — S51011A Laceration without foreign body of right elbow, initial encounter: Secondary | ICD-10-CM

## 2020-01-23 DIAGNOSIS — I82411 Acute embolism and thrombosis of right femoral vein: Secondary | ICD-10-CM | POA: Diagnosis not present

## 2020-01-23 DIAGNOSIS — E039 Hypothyroidism, unspecified: Secondary | ICD-10-CM | POA: Diagnosis not present

## 2020-01-23 DIAGNOSIS — R233 Spontaneous ecchymoses: Secondary | ICD-10-CM

## 2020-01-23 DIAGNOSIS — R600 Localized edema: Secondary | ICD-10-CM

## 2020-01-23 DIAGNOSIS — R197 Diarrhea, unspecified: Secondary | ICD-10-CM

## 2020-01-23 MED ORDER — GABAPENTIN 100 MG PO CAPS: 100.0000 mg | ORAL_CAPSULE | Freq: Every evening | ORAL | 12 refills | Status: DC | PRN

## 2020-01-23 MED ORDER — LOPERAMIDE HCL 2 MG PO CAPS: 2.0000 mg | ORAL_CAPSULE | Freq: Every day | ORAL | 5 refills | Status: DC | PRN

## 2020-01-23 NOTE — Progress Notes (Signed)
 Subjective: CC:  DVT, HTN PCP: Gottschalk, Ashly M, DO HPI:Kelsey Mills is a 84 y.o. female who is accompanied today by her daughter.  She is presenting to clinic today for:  1. Acute DVT/bilateral lower extremity edema Patient found to have a DVT 11/30/2019 in her right femoral vein.  This was thought to be caused by immobility secondary to back surgery prior to the event.   No known family history of clotting disorder.  Continues to use the Xarelto 20 mg daily.  Bruising has gotten much better since she is reduced the frequency and total daily dose.  She continues to have quite a bit of right lower extremity swelling that is minimally improved with Lasix and elevation of lower extremities.  She does admit that she is not really compliant with a low-salt diet.  She continues to have some burning sensation in the right lower extremity.  The gabapentin does help some but she would like to increase the dose.  2.  Acquired hypothyroidism Patient noted to have elevated TSH on her last checkup.  She is compliant with Synthroid 25 mcg daily.  She reports lower extremity edema as above and also reports intermittent diarrhea that is relieved by Imodium 2 mg.  She rarely uses the Imodium unless she is going out so that she does not have a fecal accident.  Does not report any blood in stool.   ROS: Per HPI  Allergies  Allergen Reactions  . Tape Other (See Comments)    SKIN IS VERY THIN AND TEARS/BLEEDS VERY EASILY!!!!  . Trazodone And Nefazodone Nausea And Vomiting  . Mesalamine Rash   Past Medical History:  Diagnosis Date  . Arthritis   . Depression   . DVT (deep venous thrombosis) (HCC)   . GERD (gastroesophageal reflux disease)   . Heart disease   . Hyperlipidemia   . Hypertension   . Insomnia   . Lymphocytic colitis   . Spinal stenosis, lumbar   . Thyroid disease     Current Outpatient Medications:  .  acetaminophen (TYLENOL) 650 MG CR tablet, Take 1,300 mg by mouth in the  morning., Disp: , Rfl:  .  aspirin EC 81 MG tablet, Take 1 tablet (81 mg total) by mouth daily. Swallow whole., Disp: 90 tablet, Rfl: 3 .  Cholecalciferol (VITAMIN D3) 20 MCG (800 UNIT) TABS, Take 1 tablet by mouth daily. Except for Saturday and Sunday, Disp: , Rfl:  .  escitalopram (LEXAPRO) 5 MG tablet, Take 1 tablet (5 mg total) by mouth daily., Disp: 90 tablet, Rfl: 3 .  furosemide (LASIX) 40 MG tablet, Take 80 mg by mouth daily., Disp: , Rfl:  .  gabapentin (NEURONTIN) 100 MG capsule, Take 100 mg by mouth daily as needed (pain). , Disp: , Rfl:  .  levothyroxine (SYNTHROID) 25 MCG tablet, Take 25 mcg by mouth daily before breakfast., Disp: , Rfl:  .  losartan (COZAAR) 25 MG tablet, Take 0.5-1 tablets (12.5-25 mg total) by mouth daily., Disp: 30 tablet, Rfl: 1 .  Multiple Vitamins-Minerals (PRESERVISION AREDS PO), Take 1 capsule by mouth daily. , Disp: , Rfl:  .  nitroGLYCERIN (NITROSTAT) 0.4 MG SL tablet, Place 1 tablet (0.4 mg total) under the tongue every 5 (five) minutes as needed., Disp: 25 tablet, Rfl: 6 .  potassium chloride SA (KLOR-CON) 20 MEQ tablet, Take 20 mEq by mouth daily., Disp: , Rfl:  .  rivaroxaban (XARELTO) 20 MG TABS tablet, Take 1 tablet (20 mg total) by mouth daily   with supper., Disp: 30 tablet, Rfl: 4 .  loperamide (IMODIUM) 2 MG capsule, Take 1 capsule (2 mg total) by mouth daily as needed for diarrhea or loose stools., Disp: 30 capsule, Rfl: 5 Social History   Socioeconomic History  . Marital status: Widowed    Spouse name: Not on file  . Number of children: 4  . Years of education: Not on file  . Highest education level: Not on file  Occupational History  . Not on file  Tobacco Use  . Smoking status: Never Smoker  . Smokeless tobacco: Never Used  Vaping Use  . Vaping Use: Never used  Substance and Sexual Activity  . Alcohol use: No  . Drug use: No  . Sexual activity: Not on file  Other Topics Concern  . Not on file  Social History Narrative   Lives at  home - in studio apartment at daughters house   Social Determinants of Health   Financial Resource Strain:   . Difficulty of Paying Living Expenses: Not on file  Food Insecurity:   . Worried About Running Out of Food in the Last Year: Not on file  . Ran Out of Food in the Last Year: Not on file  Transportation Needs:   . Lack of Transportation (Medical): Not on file  . Lack of Transportation (Non-Medical): Not on file  Physical Activity:   . Days of Exercise per Week: Not on file  . Minutes of Exercise per Session: Not on file  Stress:   . Feeling of Stress : Not on file  Social Connections:   . Frequency of Communication with Friends and Family: Not on file  . Frequency of Social Gatherings with Friends and Family: Not on file  . Attends Religious Services: Not on file  . Active Member of Clubs or Organizations: Not on file  . Attends Club or Organization Meetings: Not on file  . Marital Status: Not on file  Intimate Partner Violence:   . Fear of Current or Ex-Partner: Not on file  . Emotionally Abused: Not on file  . Physically Abused: Not on file  . Sexually Abused: Not on file   Family History  Problem Relation Age of Onset  . Heart attack Mother        54  . Diabetes Mother   . Other Father        black lung   . Heart disease Father   . Heart disease Sister        stents   . Heart disease Brother   . Other Brother        parkinson   . COPD Daughter   . Diabetes Daughter   . Heart disease Daughter   . Hypertension Daughter   . Hyperlipidemia Daughter   . Thyroid disease Daughter   . COPD Son   . Heart disease Son   . Hypertension Son   . Hyperlipidemia Son   . Arthritis Son        rheum   . AAA (abdominal aortic aneurysm) Brother   . COPD Daughter   . Heart disease Daughter   . Hypertension Daughter   . Thyroid disease Daughter   . Arthritis Daughter        rheum   . Heart disease Daughter   . Heart attack Daughter   . Hyperlipidemia Daughter   .  Thyroid disease Daughter     Objective: Office vital signs reviewed. BP 140/81   Pulse 97   Temp   98.1 F (36.7 C)   Ht 5' 2" (1.575 m)   Wt 187 lb 9.6 oz (85.1 kg)   SpO2 94%   BMI 34.31 kg/m   Physical Examination:  General: Awake, alert, No acute distress HEENT: Normal, sclera white, MMM; no goiter.  No exophthalmos Cardio: regular rate and rhythm, S1S2 heard, no murmurs appreciated Pulm: clear to auscultation bilaterally, no wheezes, rhonchi or rales; normal work of breathing on room air Extremities: warm, well perfused, 2-+ pitting edema of RLE and 2+ pitting edema of LLE, No cyanosis or clubbing; +2 pulses bilaterally MSK: slow gait.  Utilizing cane for ambulation Skin: Ecchymosis and petechial bleeds appreciated in bilateral upper extremities; she has a skin tear present on the left forearm and right elbow Neuro: No tremor  Assessment/ Plan: 84 y.o. female   Acquired hypothyroidism - Plan: Thyroid Panel With TSH  Acute deep vein thrombosis (DVT) of femoral vein of right lower extremity ([place]) - Plan: CBC  On anticoagulant therapy - Plan: CBC  Easy bruisability - Plan: CBC  Skin tear of right elbow without complication, initial encounter  Bilateral edema of lower extremity - Plan: CBC, CMP14+EGFR  Intermittent diarrhea - Plan: loperamide (IMODIUM) 2 MG capsule  She has 2+ pitting edema bilaterally with right greater than left.  Much of this is likely venous stasis changes and in the setting of acute DVT.  Reinforced elevation of lower extremities.  Would hesitate to use compression hose given recent DVT and ongoing treatment for acute DVT.  We discussed a low-sodium diet.  Okay to increase gabapentin up to 300 mg nightly if needed but we discussed that this may also exacerbate lower extremity edema.  Recheck CBC given slight decline in hemoglobin last visit, thyroid panel.  Imodium sent for as needed use.  She appears to be using this very sparingly.  Continue Lasix as  prescribed.  I would like to see her back in 3 to 4 months for repeat checkup and likely discontinuation of the Xarelto given suspected provoked DVT.  She will follow-up with me in 6 months for thyroid checkup  Orders Placed This Encounter  Procedures  . Thyroid Panel With TSH  . CBC  . CMP14+EGFR   Meds ordered this encounter  Medications  . loperamide (IMODIUM) 2 MG capsule    Sig: Take 1 capsule (2 mg total) by mouth daily as needed for diarrhea or loose stools.    Dispense:  30 capsule    Refill:  5  . gabapentin (NEURONTIN) 100 MG capsule    Sig: Take 1-3 capsules (100-300 mg total) by mouth at bedtime as needed (pain).    Dispense:  90 capsule    Refill:  [place] [place] [phone]

## 2020-01-23 NOTE — Patient Instructions (Signed)

## 2020-01-24 LAB — CMP14+EGFR
ALT: 11 IU/L (ref 0–32)
AST: 16 IU/L (ref 0–40)
Albumin/Globulin Ratio: 1.7 (ref 1.2–2.2)
Albumin: 4 g/dL (ref 3.6–4.6)
Alkaline Phosphatase: 133 IU/L — ABNORMAL HIGH (ref 48–121)
BUN/Creatinine Ratio: 15 (ref 12–28)
BUN: 13 mg/dL (ref 8–27)
Bilirubin Total: 0.5 mg/dL (ref 0.0–1.2)
CO2: 28 mmol/L (ref 20–29)
Calcium: 9.1 mg/dL (ref 8.7–10.3)
Chloride: 99 mmol/L (ref 96–106)
Creatinine, Ser: 0.87 mg/dL (ref 0.57–1.00)
GFR calc Af Amer: 72 mL/min/{1.73_m2} (ref >59)
GFR calc non Af Amer: 63 mL/min/{1.73_m2} (ref >59)
Globulin, Total: 2.4 g/dL (ref 1.5–4.5)
Glucose: 119 mg/dL — ABNORMAL HIGH (ref 65–99)
Potassium: 3.4 mmol/L — ABNORMAL LOW (ref 3.5–5.2)
Sodium: 139 mmol/L (ref 134–144)
Total Protein: 6.4 g/dL (ref 6.0–8.5)

## 2020-01-24 LAB — THYROID PANEL WITH TSH
Free Thyroxine Index: 2.9 (ref 1.2–4.9)
T3 Uptake Ratio: 30 % (ref 24–39)
T4, Total: 9.6 ug/dL (ref 4.5–12.0)
TSH: 1.97 u[IU]/mL (ref 0.450–4.500)

## 2020-01-24 LAB — CBC
Hematocrit: 39.3 % (ref 34.0–46.6)
Hemoglobin: 13.2 g/dL (ref 11.1–15.9)
MCH: 33.7 pg — ABNORMAL HIGH (ref 26.6–33.0)
MCHC: 33.6 g/dL (ref 31.5–35.7)
MCV: 100 fL — ABNORMAL HIGH (ref 79–97)
Platelets: 291 10*3/uL (ref 150–450)
RBC: 3.92 x10E6/uL (ref 3.77–5.28)
RDW: 12 % (ref 11.7–15.4)
WBC: 9.1 10*3/uL (ref 3.4–10.8)

## 2020-01-24 MED ORDER — ATORVASTATIN CALCIUM 10 MG PO TABS
10.0000 mg | ORAL_TABLET | Freq: Every day | ORAL | 3 refills | Status: DC
Start: 2020-01-24 — End: 2020-04-23

## 2020-01-24 NOTE — Addendum Note (Signed)
Addended by: Eleonore Chiquito on: 01/24/2020 11:18 AM   Modules accepted: Orders

## 2020-01-25 ENCOUNTER — Other Ambulatory Visit: Payer: Self-pay | Admitting: Family Medicine

## 2020-01-25 MED ORDER — LEVOTHYROXINE SODIUM 25 MCG PO TABS: 25.0000 ug | ORAL_TABLET | Freq: Every day | ORAL | 3 refills | Status: DC

## 2020-02-13 ENCOUNTER — Other Ambulatory Visit: Payer: Self-pay | Admitting: Family Medicine

## 2020-02-13 DIAGNOSIS — I10 Essential (primary) hypertension: Secondary | ICD-10-CM

## 2020-02-29 ENCOUNTER — Ambulatory Visit (INDEPENDENT_AMBULATORY_CARE_PROVIDER_SITE_OTHER): Payer: Medicare HMO | Admitting: Nurse Practitioner

## 2020-02-29 ENCOUNTER — Other Ambulatory Visit: Payer: Self-pay

## 2020-02-29 DIAGNOSIS — R509 Fever, unspecified: Secondary | ICD-10-CM

## 2020-02-29 DIAGNOSIS — Z20822 Contact with and (suspected) exposure to covid-19: Secondary | ICD-10-CM

## 2020-02-29 NOTE — Progress Notes (Signed)
   Virtual Visit via telephone Note Due to COVID-19 pandemic this visit was conducted virtually. This visit type was conducted due to national recommendations for restrictions regarding the COVID-19 Pandemic (e.g. social distancing, sheltering in place) in an effort to limit this patient's exposure and mitigate transmission in our community. All issues noted in this document were discussed and addressed.  A physical exam was not performed with this format.  I connected with Khelani Kops on 02/29/20 at 12:05 by telephone and verified that I am speaking with the correct person using two identifiers. Zyon Reddish is currently located at home and no one is currently with her during visit. The provider, Mary-Margaret Daphine Deutscher, FNP is located in their office at time of visit.  I discussed the limitations, risks, security and privacy concerns of performing an evaluation and management service by telephone and the availability of in person appointments. I also discussed with the patient that there may be a patient responsible charge related to this service. The patient expressed understanding and agreed to proceed.   History and Present Illness:  Chief Complaint: Covid Exposure   HPI Patiens daughter calls in stating patient was exposed to covid on Saturday. Tuesday she developed nausea, vomiting, muscle aches, fever, headache and sore throat. Also has slight cough. Fever has been low grade.  Has had both covid vaccines.  Review of Systems  Constitutional: Positive for chills and fever.  HENT: Positive for congestion and sore throat.   Respiratory: Positive for cough (slight).   Gastrointestinal: Positive for nausea and vomiting.  Musculoskeletal: Positive for myalgias.  Neurological: Positive for dizziness and headaches.  All other systems reviewed and are negative.    Observations/Objective: Alert and oriented- answers all questions appropriately moderate distress No cough or sob  noted   Assessment and Plan: Doninique Lwin in today with chief complaint of Covid Exposure   1. Fever with exposure to COVID-19 virus quaraqntine until tests are back OTC cough meds motrin or tylenol for body aches To ER if develops SOB - Novel Coronavirus, N[facility] ([facility]); Future      Follow Up Instructions: prn    I discussed the assessment and treatment plan with the patient. The patient was provided an opportunity to ask questions and all were answered. The patient agreed with the plan and demonstrated an understanding of the instructions.   The patient was advised to call back or seek an in-person evaluation if the symptoms worsen or if the condition fails to improve as anticipated.  The above assessment and management plan was discussed with the patient. The patient verbalized understanding of and has agreed to the management plan. Patient is aware to call the clinic if symptoms persist or worsen. Patient is aware when to return to the clinic for a follow-up visit. Patient educated on when it is appropriate to go to the emergency department.   Time call ended:  12:21  I provided 16 minutes of non-face-to-face time during this encounter.    Mary-Margaret Daphine Deutscher, FNP

## 2020-02-29 NOTE — Addendum Note (Signed)
Addended by: Margorie John on: 02/29/2020 02:04 PM   Modules accepted: Orders

## 2020-03-01 LAB — SARS-COV-2, NAA 2 DAY TAT

## 2020-03-01 LAB — NOVEL CORONAVIRUS, NAA: SARS-CoV-2, NAA: NOT DETECTED

## 2020-04-12 ENCOUNTER — Telehealth (INDEPENDENT_AMBULATORY_CARE_PROVIDER_SITE_OTHER): Payer: Medicare HMO | Admitting: Family Medicine

## 2020-04-12 ENCOUNTER — Telehealth: Payer: Self-pay

## 2020-04-12 DIAGNOSIS — R300 Dysuria: Secondary | ICD-10-CM | POA: Diagnosis not present

## 2020-04-12 MED ORDER — CEPHALEXIN 500 MG PO CAPS: 500.0000 mg | ORAL_CAPSULE | Freq: Two times a day (BID) | ORAL | 0 refills | Status: AC

## 2020-04-12 NOTE — Telephone Encounter (Signed)
Spoke with daughter, she is going to give her plenty of fluids this weekend and if she does not improve will take her to the urgent care over the weekend.

## 2020-04-12 NOTE — Telephone Encounter (Signed)
Pts daughter called stating that she believes pt might have a UTI. Wants to know if she can be worked in today at least for a televisit so that an order for urine sample can be ordered for her.  Please advise. Call [place] @ [phone]

## 2020-04-12 NOTE — Telephone Encounter (Signed)
Ok to bring urine sample in and double book my 630 televisit.  I will reach out as soon as I've completed my scheduled pt visits.

## 2020-04-12 NOTE — Telephone Encounter (Signed)
Telephone visit  Subjective: CC: UTi PCP: Raliegh Ip, DO SNK:[id number] [place] is a 84 y.o. female calls for telephone consult today. Patient provides verbal consent for consult held via phone.  Due to COVID-19 pandemic this visit was conducted virtually. This visit type was conducted due to national recommendations for restrictions regarding the COVID-19 Pandemic (e.g. social distancing, sheltering in place) in an effort to limit this patient's exposure and mitigate transmission in our community. All issues noted in this document were discussed and addressed.  A physical exam was not performed with this format.   Location of patient: home Location of provider: [facility] Others present for call: daughter, [name]  1. UTI? Daughter reports that patient has been complaining of dysuria.  She is unsure if this is externally versus internally.  She wears adult diapers.  No pelvic pain, hematuria, urinary frequency.  She is on Lasix and therefore pees a lot anyways.  Has not seen any fevers.  They are planning on going to [place] soon because patient's son will be going through a heart and lung transplant after Covid damage to his heart lungs.  ROS: Per HPI  Allergies  Allergen Reactions  . Tape Other (See Comments)    SKIN IS VERY THIN AND TEARS/BLEEDS VERY EASILY!!!!  . Trazodone And Nefazodone Nausea And Vomiting  . Mesalamine Rash   Past Medical History:  Diagnosis Date  . Arthritis   . Depression   . DVT (deep venous thrombosis) (HCC)   . GERD (gastroesophageal reflux disease)   . Heart disease   . Hyperlipidemia   . Hypertension   . Insomnia   . Lymphocytic colitis   . Spinal stenosis, lumbar   . Thyroid disease     Current Outpatient Medications:  .  acetaminophen (TYLENOL) 650 MG CR tablet, Take 1,300 mg by mouth in the morning., Disp: , Rfl:  .  aspirin EC 81 MG tablet, Take 1 tablet (81 mg total) by mouth daily. Swallow whole., Disp: 90 tablet, Rfl: 3 .  atorvastatin  (LIPITOR) 10 MG tablet, Take 1 tablet (10 mg total) by mouth daily., Disp: 90 tablet, Rfl: 3 .  Cholecalciferol (VITAMIN D3) 20 MCG (800 UNIT) TABS, Take 1 tablet by mouth daily. Except for Saturday and Sunday, Disp: , Rfl:  .  escitalopram (LEXAPRO) 5 MG tablet, Take 1 tablet (5 mg total) by mouth daily., Disp: 90 tablet, Rfl: 3 .  furosemide (LASIX) 40 MG tablet, Take 80 mg by mouth daily., Disp: , Rfl:  .  gabapentin (NEURONTIN) 100 MG capsule, Take 1-3 capsules (100-300 mg total) by mouth at bedtime as needed (pain)., Disp: 90 capsule, Rfl: 12 .  levothyroxine (SYNTHROID) 25 MCG tablet, Take 1 tablet (25 mcg total) by mouth daily before breakfast., Disp: 90 tablet, Rfl: 3 .  loperamide (IMODIUM) 2 MG capsule, Take 1 capsule (2 mg total) by mouth daily as needed for diarrhea or loose stools., Disp: 30 capsule, Rfl: 5 .  losartan (COZAAR) 25 MG tablet, TAKE 0.5-1 TABLETS (12.5-25 MG TOTAL) BY MOUTH DAILY., Disp: 30 tablet, Rfl: 1 .  Multiple Vitamins-Minerals (PRESERVISION AREDS PO), Take 1 capsule by mouth daily. , Disp: , Rfl:  .  nitroGLYCERIN (NITROSTAT) 0.4 MG SL tablet, Place 1 tablet (0.4 mg total) under the tongue every 5 (five) minutes as needed., Disp: 25 tablet, Rfl: 6 .  potassium chloride SA (KLOR-CON) 20 MEQ tablet, Take 20 mEq by mouth daily., Disp: , Rfl:  .  rivaroxaban (XARELTO) 20 MG TABS tablet, Take  1 tablet (20 mg total) by mouth daily with supper., Disp: 30 tablet, Rfl: 4  Assessment/ Plan: 84 y.o. female   Dysuria - Plan: cephALEXin (KEFLEX) 500 MG capsule Home care instructions reviewed.  Push oral fluids.  Cranberry tablets okay.  Empiric treatment with Keflex.  We discussed signs and symptoms warranting further evaluation.  Her daughter voiced good understanding and will follow up as needed  Start time: 4:55pm End time: 5:00pm  Total time spent on patient care (including telephone call/ virtual visit): 5 minutes  Mansi Tokar Hulen Skains, DO [facility] [phone]

## 2020-04-23 ENCOUNTER — Ambulatory Visit (INDEPENDENT_AMBULATORY_CARE_PROVIDER_SITE_OTHER): Payer: Medicare HMO | Admitting: Family Medicine

## 2020-04-23 ENCOUNTER — Encounter: Payer: Self-pay | Admitting: Family Medicine

## 2020-04-23 ENCOUNTER — Other Ambulatory Visit: Payer: Self-pay

## 2020-04-23 VITALS — BP 107/58 | HR 74 | Temp 98.5°F | Ht 62.0 in | Wt 179.0 lb

## 2020-04-23 DIAGNOSIS — H259 Unspecified age-related cataract: Secondary | ICD-10-CM

## 2020-04-23 DIAGNOSIS — Z7901 Long term (current) use of anticoagulants: Secondary | ICD-10-CM | POA: Diagnosis not present

## 2020-04-23 DIAGNOSIS — E039 Hypothyroidism, unspecified: Secondary | ICD-10-CM

## 2020-04-23 DIAGNOSIS — I10 Essential (primary) hypertension: Secondary | ICD-10-CM | POA: Diagnosis not present

## 2020-04-23 DIAGNOSIS — Z6379 Other stressful life events affecting family and household: Secondary | ICD-10-CM

## 2020-04-23 MED ORDER — ESCITALOPRAM OXALATE 5 MG PO TABS
5.0000 mg | ORAL_TABLET | Freq: Every day | ORAL | 3 refills | Status: DC
Start: 2020-04-23 — End: 2020-12-11

## 2020-04-23 MED ORDER — LEVOTHYROXINE SODIUM 25 MCG PO TABS
25.0000 ug | ORAL_TABLET | Freq: Every day | ORAL | 3 refills | Status: DC
Start: 2020-04-23 — End: 2021-04-07

## 2020-04-23 MED ORDER — GABAPENTIN 100 MG PO CAPS
100.0000 mg | ORAL_CAPSULE | Freq: Every evening | ORAL | 12 refills | Status: DC | PRN
Start: 2020-04-23 — End: 2021-06-13

## 2020-04-23 MED ORDER — LISINOPRIL 5 MG PO TABS: 5.0000 mg | ORAL_TABLET | Freq: Every day | ORAL | 3 refills | Status: DC

## 2020-04-23 NOTE — Patient Instructions (Signed)
Either see me back in February or come in for labs. Plan to stop Xarelto in January when you run out of meds.

## 2020-04-23 NOTE — Progress Notes (Signed)
Subjective: CC: Follow-up hypothyroidism PCP: Janora Norlander, DO [id number] Kelsey Mills is a 84 y.o. female presenting to clinic today for:  1.  Hypothyroidism/depression Patient reports compliance with Synthroid 25 mcg daily.  No reports of tremor.  She does report some depressive symptoms but her son is currently ill.  He was recently transferred from the ICU to the stepdown unit and there are plans for possible transfer elsewhere.  He may need a heart and lung transplant.  This is because her stress and causes decreased appetite.  She takes Lexapro 5 mg daily  2.  Hypertension Patient was unable to tolerate the losartan due to headache.  She was switched back to lisinopril 10 mg daily and has been compliant with that.  Not checking blood pressures at home.  Does not report any dizziness, chest pain.  3.  Cataract Patient reports history of left cataract that was extracted.  She does need to get her right one done and has pretty much no vision in the right eye due to the cataract.  Asking for referral today.   ROS: Per HPI  Allergies  Allergen Reactions  . Tape Other (See Comments)    SKIN IS VERY THIN AND TEARS/BLEEDS VERY EASILY!!!!  . Trazodone And Nefazodone Nausea And Vomiting  . Mesalamine Rash   Past Medical History:  Diagnosis Date  . Arthritis   . Depression   . DVT (deep venous thrombosis) ([place])   . GERD (gastroesophageal reflux disease)   . Heart disease   . Hyperlipidemia   . Hypertension   . Insomnia   . Lymphocytic colitis   . Spinal stenosis, lumbar   . Thyroid disease     Current Outpatient Medications:  .  acetaminophen (TYLENOL) 650 MG CR tablet, Take 1,300 mg by mouth in the morning., Disp: , Rfl:  .  aspirin EC 81 MG tablet, Take 1 tablet (81 mg total) by mouth daily. Swallow whole., Disp: 90 tablet, Rfl: 3 .  Cholecalciferol (VITAMIN D3) 20 MCG (800 UNIT) TABS, Take 1 tablet by mouth daily. Except for Saturday and Sunday, Disp: , Rfl:  .   escitalopram (LEXAPRO) 5 MG tablet, Take 1 tablet (5 mg total) by mouth daily., Disp: 90 tablet, Rfl: 3 .  furosemide (LASIX) 40 MG tablet, Take 80 mg by mouth daily., Disp: , Rfl:  .  gabapentin (NEURONTIN) 100 MG capsule, Take 1-3 capsules (100-300 mg total) by mouth at bedtime as needed (pain)., Disp: 90 capsule, Rfl: 12 .  levothyroxine (SYNTHROID) 25 MCG tablet, Take 1 tablet (25 mcg total) by mouth daily before breakfast., Disp: 90 tablet, Rfl: 3 .  lisinopril (ZESTRIL) 10 MG tablet, Take 10 mg by mouth daily., Disp: , Rfl:  .  loperamide (IMODIUM) 2 MG capsule, Take 1 capsule (2 mg total) by mouth daily as needed for diarrhea or loose stools., Disp: 30 capsule, Rfl: 5 .  Multiple Vitamins-Minerals (PRESERVISION AREDS PO), Take 1 capsule by mouth daily. , Disp: , Rfl:  .  potassium chloride SA (KLOR-CON) 20 MEQ tablet, Take 20 mEq by mouth daily., Disp: , Rfl:  .  rivaroxaban (XARELTO) 20 MG TABS tablet, Take 1 tablet (20 mg total) by mouth daily with supper., Disp: 30 tablet, Rfl: 4 .  nitroGLYCERIN (NITROSTAT) 0.4 MG SL tablet, Place 1 tablet (0.4 mg total) under the tongue every 5 (five) minutes as needed., Disp: 25 tablet, Rfl: 6 Social History   Socioeconomic History  . Marital status: Widowed    Spouse name: Not  on file  . Number of children: 4  . Years of education: Not on file  . Highest education level: Not on file  Occupational History  . Not on file  Tobacco Use  . Smoking status: Never Smoker  . Smokeless tobacco: Never Used  Vaping Use  . Vaping Use: Never used  Substance and Sexual Activity  . Alcohol use: No  . Drug use: No  . Sexual activity: Not on file  Other Topics Concern  . Not on file  Social History Narrative   Lives at home - in studio apartment at daughters house   Social Determinants of Health   Financial Resource Strain:   . Difficulty of Paying Living Expenses: Not on file  Food Insecurity:   . Worried About Running Out of Food in the Last  Year: Not on file  . Ran Out of Food in the Last Year: Not on file  Transportation Needs:   . Lack of Transportation (Medical): Not on file  . Lack of Transportation (Non-Medical): Not on file  Physical Activity:   . Days of Exercise per Week: Not on file  . Minutes of Exercise per Session: Not on file  Stress:   . Feeling of Stress : Not on file  Social Connections:   . Frequency of Communication with Friends and Family: Not on file  . Frequency of Social Gatherings with Friends and Family: Not on file  . Attends Religious Services: Not on file  . Active Member of Clubs or Organizations: Not on file  . Attends Club or Organization Meetings: Not on file  . Marital Status: Not on file  Intimate Partner Violence:   . Fear of Current or Ex-Partner: Not on file  . Emotionally Abused: Not on file  . Physically Abused: Not on file  . Sexually Abused: Not on file   Family History  Problem Relation Age of Onset  . Heart attack Mother        54  . Diabetes Mother   . Other Father        black lung   . Heart disease Father   . Heart disease Sister        stents   . Heart disease Brother   . Other Brother        parkinson   . COPD Daughter   . Diabetes Daughter   . Heart disease Daughter   . Hypertension Daughter   . Hyperlipidemia Daughter   . Thyroid disease Daughter   . COPD Son   . Heart disease Son   . Hypertension Son   . Hyperlipidemia Son   . Arthritis Son        rheum   . AAA (abdominal aortic aneurysm) Brother   . COPD Daughter   . Heart disease Daughter   . Hypertension Daughter   . Thyroid disease Daughter   . Arthritis Daughter        rheum   . Heart disease Daughter   . Heart attack Daughter   . Hyperlipidemia Daughter   . Thyroid disease Daughter     Objective: Office vital signs reviewed. BP (!) 107/58   Pulse 74   Temp 98.5 F (36.9 C) (Temporal)   Ht 5' 2" (1.575 m)   Wt 179 lb (81.2 kg)   SpO2 96%   BMI 32.74 kg/m   Physical  Examination:  General: Awake, alert, well nourished, No acute distress HEENT: Normal, sclera white, no exophthalmos.  No goiter Cardio:   regular rate and rhythm, S1S2 heard, no murmurs appreciated Pulm: clear to auscultation bilaterally, no wheezes, rhonchi or rales; normal work of breathing on room air Extremities: warm, well perfused, No edema, cyanosis or clubbing; +2 pulses bilaterally MSK: slow gait and hunched station Skin: dry; intact; no rashes; senile purpura noted on the left arm Neuro: No tremor Psych: Mood slightly depressed. Depression screen PHQ 2/9 04/23/2020 12/12/2019 11/09/2019  Decreased Interest 0 2 1  Down, Depressed, Hopeless 0 0 1  PHQ - 2 Score 0 2 2  Altered sleeping 0 3 0  Tired, decreased energy 0 3 3  Change in appetite 0 0 1  Feeling bad or failure about yourself  0 0 0  Trouble concentrating 0 0 1  Moving slowly or fidgety/restless 0 0 3  Suicidal thoughts 0 0 0  PHQ-9 Score 0 8 10  Difficult doing work/chores - Not difficult at all Very difficult     Assessment/ Plan: 84 y.o. female   Acquired hypothyroidism - Plan: Thyroid Panel With TSH  On anticoagulant therapy - Plan: CBC  Essential hypertension - Plan: CMP14+EGFR  Senile cataract of right eye, unspecified age-related cataract type - Plan: Ambulatory referral to Ophthalmology  Stress due to illness of family member  Asymptomatic.  Check CBC along with thyroid panel and CMP in the new year. Her blood pressure is borderline of acid they drop her lisinopril back to 5 mg.  New prescription has been sent.  Continue to monitor blood pressures closely.  May need to discontinue blood pressure medication totally Referral to ophthalmology placed for cataract extraction Okay to hold Xarelto if needed at that time but anticipate that she will be off of anticoagulation in the next month or so. She has a sick son out of state.  There are plans for possible heart and double lung transplant after Covid  infection.  Orders Placed This Encounter  Procedures  . Thyroid Panel With TSH    Standing Status:   Future    Standing Expiration Date:   04/23/2021  . CMP14+EGFR    Standing Status:   Future    Standing Expiration Date:   04/23/2021  . CBC    Standing Status:   Future    Standing Expiration Date:   04/23/2021  . Ambulatory referral to Ophthalmology    Referral Priority:   Routine    Referral Type:   Consultation    Referral Reason:   Specialty Services Required    Requested Specialty:   Ophthalmology    Number of Visits Requested:   1   Meds ordered this encounter  Medications  . lisinopril (ZESTRIL) 5 MG tablet    Sig: Take 1 tablet (5 mg total) by mouth daily.    Dispense:  90 tablet    Refill:  3  . levothyroxine (SYNTHROID) 25 MCG tablet    Sig: Take 1 tablet (25 mcg total) by mouth daily before breakfast.    Dispense:  90 tablet    Refill:  3  . gabapentin (NEURONTIN) 100 MG capsule    Sig: Take 1-3 capsules (100-300 mg total) by mouth at bedtime as needed (pain).    Dispense:  90 capsule    Refill:  12  . escitalopram (LEXAPRO) 5 MG tablet    Sig: Take 1 tablet (5 mg total) by mouth daily.    Dispense:  90 tablet    Refill:  3     Ashly M Gottschalk, DO [facility] ([id number])   [phone]

## 2020-05-26 ENCOUNTER — Other Ambulatory Visit: Payer: Self-pay | Admitting: Family Medicine

## 2020-05-26 DIAGNOSIS — Z7901 Long term (current) use of anticoagulants: Secondary | ICD-10-CM

## 2020-07-08 DIAGNOSIS — K52832 Lymphocytic colitis: Secondary | ICD-10-CM | POA: Insufficient documentation

## 2020-07-08 DIAGNOSIS — E785 Hyperlipidemia, unspecified: Secondary | ICD-10-CM | POA: Insufficient documentation

## 2020-07-08 DIAGNOSIS — K219 Gastro-esophageal reflux disease without esophagitis: Secondary | ICD-10-CM | POA: Insufficient documentation

## 2020-07-08 DIAGNOSIS — I519 Heart disease, unspecified: Secondary | ICD-10-CM | POA: Insufficient documentation

## 2020-07-08 DIAGNOSIS — M48061 Spinal stenosis, lumbar region without neurogenic claudication: Secondary | ICD-10-CM | POA: Insufficient documentation

## 2020-07-08 DIAGNOSIS — F32A Depression, unspecified: Secondary | ICD-10-CM | POA: Insufficient documentation

## 2020-07-08 DIAGNOSIS — I82409 Acute embolism and thrombosis of unspecified deep veins of unspecified lower extremity: Secondary | ICD-10-CM | POA: Insufficient documentation

## 2020-07-08 DIAGNOSIS — E079 Disorder of thyroid, unspecified: Secondary | ICD-10-CM | POA: Insufficient documentation

## 2020-07-08 DIAGNOSIS — I10 Essential (primary) hypertension: Secondary | ICD-10-CM | POA: Insufficient documentation

## 2020-07-09 ENCOUNTER — Other Ambulatory Visit: Payer: Self-pay

## 2020-07-09 ENCOUNTER — Encounter: Payer: Self-pay | Admitting: Cardiology

## 2020-07-09 ENCOUNTER — Ambulatory Visit (INDEPENDENT_AMBULATORY_CARE_PROVIDER_SITE_OTHER): Payer: 59 | Admitting: Cardiology

## 2020-07-09 VITALS — BP 114/56 | HR 82 | Ht 62.5 in | Wt 172.1 lb

## 2020-07-09 DIAGNOSIS — I2510 Atherosclerotic heart disease of native coronary artery without angina pectoris: Secondary | ICD-10-CM | POA: Diagnosis not present

## 2020-07-09 DIAGNOSIS — I209 Angina pectoris, unspecified: Secondary | ICD-10-CM

## 2020-07-09 DIAGNOSIS — E782 Mixed hyperlipidemia: Secondary | ICD-10-CM

## 2020-07-09 DIAGNOSIS — I10 Essential (primary) hypertension: Secondary | ICD-10-CM

## 2020-07-09 NOTE — Patient Instructions (Signed)

## 2020-07-09 NOTE — Progress Notes (Signed)
Cardiology Office Note:    Date:  07/09/2020   ID:  Naida Escalante, DOB June 16, 1935, MRN [id number]  PCP:  Raliegh Ip, DO  Cardiologist:  Garwin Brothers, MD   Referring MD: Raliegh Ip, DO    ASSESSMENT:    No diagnosis found. PLAN:    In order of problems listed above:  1. Coronary artery disease: Secondary prevention stressed with the patient.  Importance of compliance with diet and medication stressed and she vocalized understanding. 2. History of DVT: She is finished 6 months of therapy.  She is going to talk to her primary care physician and stop the anticoagulation I think that is reasonable understanding the benefit risk ratio.  She uses a cane to ambulate and is stable but I would still prefer that she not be on any long-term anticoagulation because of the diagnosis and also because of the fact that overall she is a fall risk I feel.  I had a lengthy discussion with the patient and the family regarding this and we concur and I agree with her primary care provider. 3. Angina pectoris: Stable she has had 3 episodes of angina in the past 6 months.  She uses 1 nitroglycerin tablet with relief.  He is not keen on any further evaluation at this time I respect her wishes. 4. Essential hypertension: Blood pressure stable and diet was emphasized. 5. Mixed dyslipidemia: Patient on appropriate therapy and has lost weight with diet and some exercise.  She promises to do better.  I reviewed her lipids with her at length. 6. Patient will be seen in follow-up appointment in 6 months or earlier if the patient has any concerns    Medication Adjustments/Labs and Tests Ordered: Current medicines are reviewed at length with the patient today.  Concerns regarding medicines are outlined above.  No orders of the defined types were placed in this encounter.  No orders of the defined types were placed in this encounter.    No chief complaint on file.    History of Present  Illness:    Derriana Oser is a 85 y.o. female.  Patient has past medical history of coronary artery disease, stable angina, essential hypertension dyslipidemia and history of DVT.  She denies any problems at this time and takes care of activities of daily living.  No chest pain orthopnea or PND.  She ambulates with a cane.  She is overweight.  She is working hard to lose weight and dieting and trying to be active.  Her daughter is very supportive and accompanies her for this visit.  At the time of my evaluation, the patient is alert awake oriented and in no distress.  Past Medical History:  Diagnosis Date  . Angina pectoris ([facility]) 11/22/2019  . Arthritis   . CAD (coronary artery disease) 12/22/2019  . Depression   . Depression with anxiety 11/09/2019  . DVT (deep venous thrombosis) ([facility])   . Essential hypertension 11/22/2019  . Generalized edema 11/09/2019  . GERD (gastroesophageal reflux disease)   . Heart disease   . Hyperlipidemia   . Hypertension   . Insomnia   . Lymphocytic colitis   . Noninfectious gastroenteritis and colitis 01/12/2013  . Pedal edema 11/22/2019  . Spinal stenosis, lumbar   . Thyroid disease   . Weight gain 01/16/2014    Past Surgical History:  Procedure Laterality Date  . CHOLECYSTECTOMY    . LEFT HEART CATH AND CORONARY ANGIOGRAPHY N/A 11/23/2019   Procedure: LEFT HEART CATH AND  CORONARY ANGIOGRAPHY;  Surgeon: Runell Gess, MD;  Location: [facility] INVASIVE CV LAB;  Service: Cardiovascular;  Laterality: N/A;  . SPINE SURGERY     2 surgeries     Current Medications: Current Meds  Medication Sig  . acetaminophen (TYLENOL) 650 MG CR tablet Take 1,300 mg by mouth in the morning.  Marland Kitchen aspirin EC 81 MG tablet Take 1 tablet (81 mg total) by mouth daily. Swallow whole.  . Cholecalciferol (VITAMIN D3) 20 MCG (800 UNIT) TABS Take 1 tablet by mouth daily. Except for Saturday and Sunday  . escitalopram (LEXAPRO) 5 MG tablet Take 1 tablet (5 mg total) by mouth daily.  .  furosemide (LASIX) 40 MG tablet Take 80 mg by mouth daily.  Marland Kitchen gabapentin (NEURONTIN) 100 MG capsule Take 1-3 capsules (100-300 mg total) by mouth at bedtime as needed (pain).  Marland Kitchen levothyroxine (SYNTHROID) 25 MCG tablet Take 1 tablet (25 mcg total) by mouth daily before breakfast.  . lisinopril (ZESTRIL) 5 MG tablet Take 1 tablet (5 mg total) by mouth daily.  Marland Kitchen loperamide (IMODIUM) 2 MG capsule Take 1 capsule (2 mg total) by mouth daily as needed for diarrhea or loose stools.  . Multiple Vitamins-Minerals (PRESERVISION AREDS PO) Take 1 capsule by mouth daily.   . potassium chloride SA (KLOR-CON) 20 MEQ tablet Take 20 mEq by mouth daily.  Carlena Hurl 20 MG TABS tablet TAKE 1 TABLET (20 MG TOTAL) BY MOUTH DAILY WITH SUPPER.     Allergies:   Tape, Trazodone and nefazodone, and Mesalamine   Social History   Socioeconomic History  . Marital status: Widowed    Spouse name: Not on file  . Number of children: 4  . Years of education: Not on file  . Highest education level: Not on file  Occupational History  . Not on file  Tobacco Use  . Smoking status: Never Smoker  . Smokeless tobacco: Never Used  Vaping Use  . Vaping Use: Never used  Substance and Sexual Activity  . Alcohol use: No  . Drug use: No  . Sexual activity: Not on file  Other Topics Concern  . Not on file  Social History Narrative   Lives at home - in studio apartment at daughters house   Social Determinants of Health   Financial Resource Strain: Not on file  Food Insecurity: Not on file  Transportation Needs: Not on file  Physical Activity: Not on file  Stress: Not on file  Social Connections: Not on file     Family History: The patient's family history includes AAA (abdominal aortic aneurysm) in her brother; Arthritis in her daughter and son; COPD in her daughter, daughter, and son; Diabetes in her daughter and mother; Heart attack in her daughter and mother; Heart disease in her brother, daughter, daughter, daughter,  father, sister, and son; Hyperlipidemia in her daughter, daughter, and son; Hypertension in her daughter, daughter, and son; Other in her brother and father; Thyroid disease in her daughter, daughter, and daughter.  ROS:   Please see the history of present illness.    All other systems reviewed and are negative.  EKGs/Labs/Other Studies Reviewed:    The following studies were reviewed today: Runell Gess, MD (Primary)      Procedures  LEFT HEART CATH AND CORONARY ANGIOGRAPHY   Conclusion    3rd Mrg lesion is 50% stenosed.  Prox RCA to Mid RCA lesion is 100% stenosed.  The left ventricular systolic function is normal.  LV end diastolic pressure  is normal.  The left ventricular ejection fraction is 55-65% by visual estimate.   Jalaysha Skilton is a 85 y.o. female    [id number] LOCATION:  FACILITY: [facility]  PHYSICIAN: Nanetta Batty, M.D. Mar 15, 1936       Recent Labs: 11/30/2019: B Natriuretic Peptide 66.7 01/23/2020: ALT 11; BUN 13; Creatinine, Ser 0.87; Hemoglobin 13.2; Platelets 291; Potassium 3.4; Sodium 139; TSH 1.970  Recent Lipid Panel    Component Value Date/Time   CHOL 177 01/19/2020 1200   TRIG 103 01/19/2020 1200   HDL 49 01/19/2020 1200   CHOLHDL 3.6 01/19/2020 1200   LDLCALC 109 (H) 01/19/2020 1200    Physical Exam:    VS:  BP (!) 114/56   Pulse 82   Ht 5' 2.5" (1.588 m)   Wt 172 lb 1.3 oz (78.1 kg)   SpO2 97%   BMI 30.97 kg/m     Wt Readings from Last 3 Encounters:  07/09/20 172 lb 1.3 oz (78.1 kg)  04/23/20 179 lb (81.2 kg)  01/23/20 187 lb 9.6 oz (85.1 kg)     GEN: Patient is in no acute distress HEENT: Normal NECK: No JVD; No carotid bruits LYMPHATICS: No lymphadenopathy CARDIAC: Hear sounds regular, 2/6 systolic murmur at the apex. RESPIRATORY:  Clear to auscultation without rales, wheezing or rhonchi  ABDOMEN: Soft, non-tender, non-distended MUSCULOSKELETAL:  No edema; No deformity  SKIN: Warm and dry NEUROLOGIC:   Alert and oriented x 3 PSYCHIATRIC:  Normal affect   Signed, Garwin Brothers, MD  07/09/2020 3:22 PM    [facility]

## 2020-08-05 ENCOUNTER — Ambulatory Visit: Payer: Medicare HMO | Admitting: Cardiology

## 2020-11-26 ENCOUNTER — Ambulatory Visit (INDEPENDENT_AMBULATORY_CARE_PROVIDER_SITE_OTHER): Payer: Medicare HMO | Admitting: Nurse Practitioner

## 2020-11-26 DIAGNOSIS — N3000 Acute cystitis without hematuria: Secondary | ICD-10-CM

## 2020-11-26 DIAGNOSIS — R300 Dysuria: Secondary | ICD-10-CM | POA: Diagnosis not present

## 2020-11-26 LAB — URINALYSIS, COMPLETE
Bilirubin, UA: NEGATIVE
Glucose, UA: NEGATIVE
Ketones, UA: NEGATIVE
Nitrite, UA: POSITIVE — AB
Protein,UA: NEGATIVE
Specific Gravity, UA: 1.01 (ref 1.005–1.030)
Urobilinogen, Ur: 0.2 mg/dL (ref 0.2–1.0)
pH, UA: 5.5 (ref 5.0–7.5)

## 2020-11-26 LAB — MICROSCOPIC EXAMINATION
Epithelial Cells (non renal): NONE SEEN /hpf (ref 0–10)
RBC, Urine: NONE SEEN /hpf (ref 0–2)
WBC, UA: >30 /hpf — AB (ref 0–5)

## 2020-11-26 MED ORDER — SULFAMETHOXAZOLE-TRIMETHOPRIM 800-160 MG PO TABS: 1.0000 | ORAL_TABLET | Freq: Two times a day (BID) | ORAL | 0 refills | Status: DC

## 2020-11-26 NOTE — Progress Notes (Signed)
   Virtual Visit  Note Due to COVID-19 pandemic this visit was conducted virtually. This visit type was conducted due to national recommendations for restrictions regarding the COVID-19 Pandemic (e.g. social distancing, sheltering in place) in an effort to limit this patient's exposure and mitigate transmission in our community. All issues noted in this document were discussed and addressed.  A physical exam was not performed with this format.  I connected with Kelsey Mills on 11/26/20 at 12:25 by telephone and verified that I am speaking with the correct person using two identifiers. Kelsey Mills is currently located at home and no one is currently with her during visit. The provider, Mary-Margaret Daphine Deutscher, FNP is located in their office at time of visit.  I discussed the limitations, risks, security and privacy concerns of performing an evaluation and management service by telephone and the availability of in person appointments. I also discussed with the patient that there may be a patient responsible charge related to this service. The patient expressed understanding and agreed to proceed.   History and Present Illness:   Chief Complaint: Urinary Tract Infection   HPI Patient c/o burning with urination. Started about 2 weeks ago and ha gotten worse.    Review of Systems  Constitutional:  Negative for fever and weight loss.  Respiratory: Negative.    Cardiovascular: Negative.   Genitourinary:  Positive for dysuria, frequency and urgency. Negative for flank pain and hematuria.  Musculoskeletal:  Positive for back pain (mild).  All other systems reviewed and are negative.   Observations/Objective: Alert and oriented- answers all questions appropriately No distress   Assessment and Plan: Kelsey Mills in today with chief complaint of Urinary Tract Infection   1. Dysuria  - Urinalysis, Complete - Urine Culture  2. Acute cystitis without hematuria Take  medication as prescribe Cotton underwear Take shower not bath Cranberry juice, yogurt Force fluids AZO over the counter X2 days Culture pending RTO prn  - sulfamethoxazole-trimethoprim (BACTRIM DS) 800-160 MG tablet; Take 1 tablet by mouth 2 (two) times daily.  Dispense: 14 tablet; Refill: 0     Follow Up Instructions: prn    I discussed the assessment and treatment plan with the patient. The patient was provided an opportunity to ask questions and all were answered. The patient agreed with the plan and demonstrated an understanding of the instructions.   The patient was advised to call back or seek an in-person evaluation if the symptoms worsen or if the condition fails to improve as anticipated.  The above assessment and management plan was discussed with the patient. The patient verbalized understanding of and has agreed to the management plan. Patient is aware to call the clinic if symptoms persist or worsen. Patient is aware when to return to the clinic for a follow-up visit. Patient educated on when it is appropriate to go to the emergency department.   Time call ended:  12:40  I provided 12 minutes of  non face-to-face time during this encounter.    Mary-Margaret Daphine Deutscher, FNP

## 2020-11-29 LAB — URINE CULTURE

## 2020-12-11 ENCOUNTER — Encounter: Payer: Self-pay | Admitting: Family Medicine

## 2020-12-11 ENCOUNTER — Ambulatory Visit (INDEPENDENT_AMBULATORY_CARE_PROVIDER_SITE_OTHER): Payer: Medicare HMO | Admitting: Family Medicine

## 2020-12-11 ENCOUNTER — Other Ambulatory Visit: Payer: Self-pay

## 2020-12-11 VITALS — BP 124/58 | HR 66 | Temp 98.2°F | Ht 62.5 in | Wt 171.8 lb

## 2020-12-11 DIAGNOSIS — E039 Hypothyroidism, unspecified: Secondary | ICD-10-CM | POA: Diagnosis not present

## 2020-12-11 DIAGNOSIS — Z7902 Long term (current) use of antithrombotics/antiplatelets: Secondary | ICD-10-CM | POA: Diagnosis not present

## 2020-12-11 DIAGNOSIS — D692 Other nonthrombocytopenic purpura: Secondary | ICD-10-CM | POA: Diagnosis not present

## 2020-12-11 DIAGNOSIS — I10 Essential (primary) hypertension: Secondary | ICD-10-CM | POA: Diagnosis not present

## 2020-12-11 DIAGNOSIS — F418 Other specified anxiety disorders: Secondary | ICD-10-CM

## 2020-12-11 DIAGNOSIS — G629 Polyneuropathy, unspecified: Secondary | ICD-10-CM

## 2020-12-11 DIAGNOSIS — R718 Other abnormality of red blood cells: Secondary | ICD-10-CM

## 2020-12-11 MED ORDER — ESCITALOPRAM OXALATE 10 MG PO TABS: 10.0000 mg | ORAL_TABLET | Freq: Every day | ORAL | 3 refills | Status: DC

## 2020-12-11 NOTE — Patient Instructions (Signed)
Alpha lipoic acid up to 600 mg daily.  Pepcid 20mg  daily or omeprazole 20mg  daily x4 weeks trial. Suspect cough/ throat sensation to be secondary to uncontrolled GERD  You had labs performed today.  You will be contacted with the results of the labs once they are available, usually in the next 3 business days for routine lab work.  If you have an active my chart account, they will be released to your MyChart.  If you prefer to have these labs released to you via telephone, please let know.  Food Choices for Gastroesophageal Reflux Disease, Adult When you have gastroesophageal reflux disease (GERD), the foods you eat and your eating habits are very important. Choosing the right foods can help ease your discomfort. Think about working with a food expert (dietitian) to help you make good choices. What are tips for following this plan? Reading food labels Look for foods that are low in saturated fat. Foods that may help with your symptoms include: Foods that have less than 5% of daily value (DV) of fat. Foods that have 0 grams of trans fat. Cooking Do not fry your food. Cook your food by baking, steaming, grilling, or broiling. These are all methods that do not need a lot of fat for cooking. To add flavor, try to use herbs that are low in spice and acidity. Meal planning  Choose healthy foods that are low in fat, such as: Fruits and vegetables. Whole grains. Low-fat dairy products. Lean meats, fish, and poultry. Eat small meals often instead of eating 3 large meals each day. Eat your meals slowly in a place where you are relaxed. Avoid bending over or lying down until 2-3 hours after eating. Limit high-fat foods such as fatty meats or fried foods. Limit your intake of fatty foods, such as oils, butter, and shortening. Avoid the following as told by your doctor: Foods that cause symptoms. These may be different for different people. Keep a food diary to keep track of foods that cause  symptoms. Alcohol. Drinking a lot of liquid with meals. Eating meals during the 2-3 hours before bed.  Lifestyle Stay at a healthy weight. Ask your doctor what weight is healthy for you. If you need to lose weight, work with your doctor to do so safely. Exercise for at least 30 minutes on 5 or more days each week, or as told by your doctor. Wear loose-fitting clothes. Do not smoke or use any products that contain nicotine or tobacco. If you need help quitting, ask your doctor. Sleep with the head of your bed higher than your feet. Use a wedge under the mattress or blocks under the bed frame to raise the head of the bed. Chew sugar-free gum after meals. What foods should eat?  Eat a healthy, well-balanced diet of fruits, vegetables, whole grains, low-fatdairy products, lean meats, fish, and poultry. Each person is different. Foods that may cause symptoms in one person may not cause any symptoms inanother person. Work with your doctor to find foods that are safe for you. The items listed above may not be a complete list of what you can eat and drink. Contact a food expert for more options. What foods should I avoid? Limiting some of these foods may help in managing the symptoms of GERD. Everyone is different. Talk with a food expert or your doctor to help you findthe exact foods to avoid, if any. Fruits Any fruits prepared with added fat. Any fruits that cause symptoms. For some  people, this may include citrus fruits, such as oranges, grapefruit, pineapple,and lemons. Vegetables Deep-fried vegetables. [place] fries. Any vegetables prepared with added fat. Any vegetables that cause symptoms. For some people, this may include tomatoesand tomato products, chili peppers, onions and garlic, and horseradish. Grains Pastries or quick breads with added fat. Meats and other proteins High-fat meats, such as fatty beef or pork, hot dogs, ribs, ham, sausage, salami, and bacon. Fried meat or protein,  including fried fish and friedchicken. Nuts and nut butters, in large amounts. Dairy Whole milk and chocolate milk. Sour cream. Cream. Ice cream. Cream cheese.Milkshakes. Fats and oils Butter. Margarine. Shortening. Ghee. Beverages Coffee and tea, with or without caffeine. Carbonated beverages. Sodas. Energy drinks. Fruit juice made with acidic fruits, such as orange or grapefruit.Tomato juice. Alcoholic drinks. Sweets and desserts Chocolate and cocoa. Donuts. Seasonings and condiments Pepper. Peppermint and spearmint. Added salt. Any condiments, herbs, or seasonings that cause symptoms. For some people, this may include curry, hotsauce, or vinegar-based salad dressings. The items listed above may not be a complete list of what you should not eat and drink. Contact a food expert for more options. Questions to ask your doctor Diet and lifestyle changes are often the first steps that are taken to manage symptoms of GERD. If diet and lifestyle changes do not help, talk with yourdoctor about taking medicines. Where to find more information [employer] G[employer]intestinal [employer]: aboutgerd.org Summary When you have GERD, food and lifestyle choices are very important in easing your symptoms. Eat small meals often instead of 3 large meals a day. Eat your meals slowly and in a place where you are relaxed. Avoid bending over or lying down until 2-3 hours after eating. Limit high-fat foods such as fatty meats or fried foods. This information is not intended to replace advice given to you by your health care provider. Make sure you discuss any questions you have with your healthcare provider. Document Revised: 11/20/2019 Document Reviewed: 11/20/2019 Elsevier Patient Education  2022 [employer].

## 2020-12-11 NOTE — Progress Notes (Signed)
Subjective: CC: Follow-up hypothyroidism, GERD PCP: Janora Norlander, DO NMM:HWKGSUP Kelsey Mills is a 85 y.o. female who is accompanied today by her daughter.  She is presenting to clinic today for:  1.  Hypothyroidism, ?GERD Patient is compliant with Synthroid 25 mcg daily.  She does admit to some dysphagia where she feels like she gets strangled on food and drink.  This is been ongoing for the last couple of months.  Sometimes she feels irritation in her throat like she needs to cough or clear her throat and sometimes feels like her throat is closing.  No reports of shortness of breath.  Her edema has been better overall since her last visit.  She continues to take Lasix and potassium as directed.  She is off of anticoagulation.  She continues to have easy bruising but admits that she continues to take aspirin daily as well.  She has not taken anything for the esophageal symptoms but her daughter did try to get her to take some PPI but she was somewhat overwhelmed by the burden of pills that she is already taking.   ROS: Per HPI  Allergies  Allergen Reactions   Tape Other (See Comments)    SKIN IS VERY THIN AND TEARS/BLEEDS VERY EASILY!!!!   Trazodone And Nefazodone Nausea And Vomiting   Mesalamine Rash   Past Medical History:  Diagnosis Date   Angina pectoris ([place]) 11/22/2019   Arthritis    CAD (coronary artery disease) 12/22/2019   Depression    Depression with anxiety 11/09/2019   DVT (deep venous thrombosis) ([facility])    Essential hypertension 11/22/2019   Generalized edema 11/09/2019   GERD (gastroesophageal reflux disease)    Heart disease    Hyperlipidemia    Hypertension    Insomnia    Lymphocytic colitis    Noninfectious gastroenteritis and colitis 01/12/2013   Pedal edema 11/22/2019   Spinal stenosis, lumbar    Thyroid disease    Weight gain 01/16/2014    Current Outpatient Medications:    acetaminophen (TYLENOL) 650 MG CR tablet, Take 1,300 mg by mouth in the  morning., Disp: , Rfl:    aspirin EC 81 MG tablet, Take 1 tablet (81 mg total) by mouth daily. Swallow whole., Disp: 90 tablet, Rfl: 3   Cholecalciferol (VITAMIN D3) 20 MCG (800 UNIT) TABS, Take 1 tablet by mouth daily. Except for Saturday and Sunday, Disp: , Rfl:    escitalopram (LEXAPRO) 5 MG tablet, Take 1 tablet (5 mg total) by mouth daily., Disp: 90 tablet, Rfl: 3   furosemide (LASIX) 40 MG tablet, Take 80 mg by mouth daily., Disp: , Rfl:    gabapentin (NEURONTIN) 100 MG capsule, Take 1-3 capsules (100-300 mg total) by mouth at bedtime as needed (pain)., Disp: 90 capsule, Rfl: 12   levothyroxine (SYNTHROID) 25 MCG tablet, Take 1 tablet (25 mcg total) by mouth daily before breakfast., Disp: 90 tablet, Rfl: 3   lisinopril (ZESTRIL) 5 MG tablet, Take 1 tablet (5 mg total) by mouth daily., Disp: 90 tablet, Rfl: 3   loperamide (IMODIUM) 2 MG capsule, Take 1 capsule (2 mg total) by mouth daily as needed for diarrhea or loose stools., Disp: 30 capsule, Rfl: 5   Multiple Vitamins-Minerals (PRESERVISION AREDS PO), Take 1 capsule by mouth daily. , Disp: , Rfl:    nitroGLYCERIN (NITROSTAT) 0.4 MG SL tablet, Place 1 tablet (0.4 mg total) under the tongue every 5 (five) minutes as needed., Disp: 25 tablet, Rfl: 6   potassium chloride SA (  KLOR-CON) 20 MEQ tablet, Take 20 mEq by mouth daily., Disp: , Rfl:    sulfamethoxazole-trimethoprim (BACTRIM DS) 800-160 MG tablet, Take 1 tablet by mouth 2 (two) times daily., Disp: 14 tablet, Rfl: 0   XARELTO 20 MG TABS tablet, TAKE 1 TABLET (20 MG TOTAL) BY MOUTH DAILY WITH SUPPER., Disp: 30 tablet, Rfl: 3 Social History   Socioeconomic History   Marital status: Widowed    Spouse name: Not on file   Number of children: 4   Years of education: Not on file   Highest education level: Not on file  Occupational History   Not on file  Tobacco Use   Smoking status: Never   Smokeless tobacco: Never  Vaping Use   Vaping Use: Never used  Substance and Sexual Activity    Alcohol use: No   Drug use: No   Sexual activity: Not on file  Other Topics Concern   Not on file  Social History Narrative   Lives at home - in studio apartment at daughters house   Social Determinants of Health   Financial Resource Strain: Not on file  Food Insecurity: Not on file  Transportation Needs: Not on file  Physical Activity: Not on file  Stress: Not on file  Social Connections: Not on file  Intimate Partner Violence: Not on file   Family History  Problem Relation Age of Onset   Heart attack Mother        8   Diabetes Mother    Other Father        black lung    Heart disease Father    Heart disease Sister        stents    Heart disease Brother    Other Brother        parkinson    COPD Daughter    Diabetes Daughter    Heart disease Daughter    Hypertension Daughter    Hyperlipidemia Daughter    Thyroid disease Daughter    COPD Son    Heart disease Son    Hypertension Son    Hyperlipidemia Son    Arthritis Son        rheum    AAA (abdominal aortic aneurysm) Brother    COPD Daughter    Heart disease Daughter    Hypertension Daughter    Thyroid disease Daughter    Arthritis Daughter        rheum    Heart disease Daughter    Heart attack Daughter    Hyperlipidemia Daughter    Thyroid disease Daughter     Objective: Office vital signs reviewed. BP (!) 124/58   Pulse 66   Temp 98.2 F (36.8 C)   Ht 5' 2.5" (1.588 m)   Wt 171 lb 12.8 oz (77.9 kg)   SpO2 95%   BMI 30.92 kg/m   Physical Examination:  General: Awake, alert, well nourished elderly female, No acute distress HEENT: Moist mucous membranes.  Oropharynx with some erythema noted along the palatopharyngeal arches bilaterally.  No ulceration Cardio: regular rate and rhythm, S1S2 heard, no murmurs appreciated Pulm: clear to auscultation bilaterally, no wheezes, rhonchi or rales; normal work of breathing on room air MSK: Requires assistive device for ambulation Neuro: Blindness in  right eye; follows commands  Depression screen [facility] 2/9 12/11/2020 04/23/2020 12/12/2019  Decreased Interest 2 0 2  Down, Depressed, Hopeless 2 0 0  PHQ - 2 Score 4 0 2  Altered sleeping 2 0 3  Tired, decreased  energy 2 0 3  Change in appetite 0 0 0  Feeling bad or failure about yourself  0 0 0  Trouble concentrating 2 0 0  Moving slowly or fidgety/restless 0 0 0  Suicidal thoughts 0 0 0  PHQ-9 Score 10 0 8  Difficult doing work/chores Not difficult at all - Not difficult at all   GAD 7 : Generalized Anxiety Score 12/11/2020 11/09/2019  Nervous, Anxious, on Edge 0 2  Control/stop worrying 2 3  Worry too much - different things 2 3  Trouble relaxing 0 1  Restless 0 0  Easily annoyed or irritable 0 0  Afraid - awful might happen 0 0  Total GAD 7 Score 4 9  Anxiety Difficulty Not difficult at all Not difficult at all      Assessment/ Plan: 85 y.o. female   Acquired hypothyroidism - Plan: TSH, T4, Free  Purpura senilis ([place])  Long term current use of antithrombotics/antiplatelets - Plan: CBC  Essential hypertension - Plan: CMP14+EGFR  Elevated MCV - Plan: Vitamin B12, CBC  Neuropathy  Depression with anxiety - Plan: escitalopram (LEXAPRO) 10 MG tablet  Check thyroid levels  Purpura senilis noted on exam but this is likely secondary to chronic aspirin use.  She is off of Xarelto totally now.  Check CBC  Blood pressure at goal.  Continue current regimen.  Check CMP  MCV noted to be elevated on last visit.  We will check a vitamin B12 level  The sensation that she is describing in the lower extremities sounds like neuropathic or sciatic type pain.  We discussed consideration for home physical therapy exercises.  These will be given to the patient  Consider alpha lipoic acid as a supplement for neuropathic pain  Lexapro increased today because of high GAD-7 and PHQ scores.  No orders of the defined types were placed in this encounter.  No orders of the defined types  were placed in this encounter.    Janora Norlander, DO [place] [phone]

## 2020-12-12 ENCOUNTER — Ambulatory Visit (INDEPENDENT_AMBULATORY_CARE_PROVIDER_SITE_OTHER): Payer: Medicare HMO

## 2020-12-12 VITALS — Ht 63.0 in | Wt 171.0 lb

## 2020-12-12 DIAGNOSIS — Z0000 Encounter for general adult medical examination without abnormal findings: Secondary | ICD-10-CM

## 2020-12-12 LAB — CBC
Hematocrit: 38.9 % (ref 34.0–46.6)
Hemoglobin: 13.3 g/dL (ref 11.1–15.9)
MCH: 32.4 pg (ref 26.6–33.0)
MCHC: 34.2 g/dL (ref 31.5–35.7)
MCV: 95 fL (ref 79–97)
Platelets: 191 10*3/uL (ref 150–450)
RBC: 4.11 x10E6/uL (ref 3.77–5.28)
RDW: 13 % (ref 11.7–15.4)
WBC: 6.7 10*3/uL (ref 3.4–10.8)

## 2020-12-12 LAB — CMP14+EGFR
ALT: 10 IU/L (ref 0–32)
AST: 16 IU/L (ref 0–40)
Albumin/Globulin Ratio: 1.4 (ref 1.2–2.2)
Albumin: 4 g/dL (ref 3.6–4.6)
Alkaline Phosphatase: 127 IU/L — ABNORMAL HIGH (ref 44–121)
BUN/Creatinine Ratio: 22 (ref 12–28)
BUN: 23 mg/dL (ref 8–27)
Bilirubin Total: 0.4 mg/dL (ref 0.0–1.2)
CO2: 26 mmol/L (ref 20–29)
Calcium: 9 mg/dL (ref 8.7–10.3)
Chloride: 101 mmol/L (ref 96–106)
Creatinine, Ser: 1.04 mg/dL — ABNORMAL HIGH (ref 0.57–1.00)
Globulin, Total: 2.9 g/dL (ref 1.5–4.5)
Glucose: 99 mg/dL (ref 65–99)
Potassium: 5 mmol/L (ref 3.5–5.2)
Sodium: 142 mmol/L (ref 134–144)
Total Protein: 6.9 g/dL (ref 6.0–8.5)
eGFR: 54 mL/min/{1.73_m2} — ABNORMAL LOW (ref >59)

## 2020-12-12 LAB — VITAMIN B12: Vitamin B-12: 232 pg/mL (ref 232–1245)

## 2020-12-12 LAB — TSH: TSH: 3.01 u[IU]/mL (ref 0.450–4.500)

## 2020-12-12 LAB — T4, FREE: Free T4: 1.45 ng/dL (ref 0.82–1.77)

## 2020-12-12 NOTE — Progress Notes (Signed)
Subjective:   Kelsey Mills is a 85 y.o. female who presents for Medicare Annual (Subsequent) preventive examination.  Virtual Visit via Telephone Note  I connected with  Kelsey Mills on 12/12/20 at  2:45 PM EDT by telephone and verified that I am speaking with the correct person using two identifiers.  Location: Patient: Home Provider: [facility] Persons participating in the virtual visit: patient/Nurse Health Advisor   I discussed the limitations, risks, security and privacy concerns of performing an evaluation and management service by telephone and the availability of in person appointments. The patient expressed understanding and agreed to proceed.  Interactive audio and video telecommunications were attempted between this nurse and patient, however failed, due to patient having technical difficulties OR patient did not have access to video capability.  We continued and completed visit with audio only.  Some vital signs may be absent or patient reported.   Kelsey Mills E [name], LPN   Review of Systems     Cardiac Risk Factors include: advanced age (>55men, >7 women);dyslipidemia;hypertension;obesity (BMI >30kg/m2);sedentary lifestyle     Objective:    Today's Vitals   12/12/20 1445  Weight: 171 lb (77.6 kg)  Height: 5\' 3"  (1.6 m)   Body mass index is 30.29 kg/m.  Advanced Directives 12/12/2020 12/19/2019 11/30/2019 11/23/2019  Does Patient Have a Medical Advance Directive? Yes No No Yes  Type of 01/24/2020 of [place];Living will Living will;Healthcare Power of Attorney Living will;Healthcare Power of Attorney Living will;Healthcare Power of Attorney  Does patient want to make changes to medical advance directive? - No - Patient declined No - Patient declined No - Patient declined  Copy of Healthcare Power of Attorney in Chart? No - copy requested - - No - copy requested  Would patient like information on creating a medical advance directive? - No -  Guardian declined No - Patient declined -    Current Medications (verified) Outpatient Encounter Medications as of 12/12/2020  Medication Sig   acetaminophen (TYLENOL) 650 MG CR tablet Take 1,300 mg by mouth in the morning.   aspirin EC 81 MG tablet Take 1 tablet (81 mg total) by mouth daily. Swallow whole.   Cholecalciferol (VITAMIN D3) 20 MCG (800 UNIT) TABS Take 1 tablet by mouth daily. Except for Saturday and Sunday   escitalopram (LEXAPRO) 10 MG tablet Take 1 tablet (10 mg total) by mouth daily.   furosemide (LASIX) 40 MG tablet Take 80 mg by mouth daily.   gabapentin (NEURONTIN) 100 MG capsule Take 1-3 capsules (100-300 mg total) by mouth at bedtime as needed (pain).   levothyroxine (SYNTHROID) 25 MCG tablet Take 1 tablet (25 mcg total) by mouth daily before breakfast.   lisinopril (ZESTRIL) 5 MG tablet Take 1 tablet (5 mg total) by mouth daily.   loperamide (IMODIUM) 2 MG capsule Take 1 capsule (2 mg total) by mouth daily as needed for diarrhea or loose stools.   Multiple Vitamins-Minerals (PRESERVISION AREDS PO) Take 1 capsule by mouth daily.    nitroGLYCERIN (NITROSTAT) 0.4 MG SL tablet Place 1 tablet (0.4 mg total) under the tongue every 5 (five) minutes as needed.   potassium chloride SA (KLOR-CON) 20 MEQ tablet Take 20 mEq by mouth daily.   XARELTO 20 MG TABS tablet TAKE 1 TABLET (20 MG TOTAL) BY MOUTH DAILY WITH SUPPER.   No facility-administered encounter medications on file as of 12/12/2020.    Allergies (verified) Tape, Trazodone and nefazodone, and Mesalamine   History: Past Medical History:  Diagnosis Date  Angina pectoris (HCC) 11/22/2019   Arthritis    CAD (coronary artery disease) 12/22/2019   Depression    Depression with anxiety 11/09/2019   DVT (deep venous thrombosis) (HCC)    Essential hypertension 11/22/2019   Generalized edema 11/09/2019   GERD (gastroesophageal reflux disease)    Heart disease    Hyperlipidemia    Hypertension    Insomnia     Lymphocytic colitis    Noninfectious gastroenteritis and colitis 01/12/2013   Pedal edema 11/22/2019   Spinal stenosis, lumbar    Thyroid disease    Weight gain 01/16/2014   Past Surgical History:  Procedure Laterality Date   CHOLECYSTECTOMY     LEFT HEART CATH AND CORONARY ANGIOGRAPHY N/A 11/23/2019   Procedure: LEFT HEART CATH AND CORONARY ANGIOGRAPHY;  Surgeon: Runell Gess, MD;  Location: [facility];  Service: Cardiovascular;  Laterality: N/A;   SPINE SURGERY     2 surgeries    Family History  Problem Relation Age of Onset   Heart attack Mother        17   Diabetes Mother    Other Father        black lung    Heart disease Father    Heart disease Sister        stents    Heart disease Brother    Other Brother        parkinson    COPD Daughter    Diabetes Daughter    Heart disease Daughter    Hypertension Daughter    Hyperlipidemia Daughter    Thyroid disease Daughter    COPD Son    Heart disease Son    Hypertension Son    Hyperlipidemia Son    Arthritis Son        rheum    AAA (abdominal aortic aneurysm) Brother    COPD Daughter    Heart disease Daughter    Hypertension Daughter    Thyroid disease Daughter    Arthritis Daughter        rheum    Heart disease Daughter    Heart attack Daughter    Hyperlipidemia Daughter    Thyroid disease Daughter    Social History   Socioeconomic History   Marital status: Widowed    Spouse name: Not on file   Number of children: 4   Years of education: Not on file   Highest education level: Not on file  Occupational History   Occupation: retired  Tobacco Use   Smoking status: Never   Smokeless tobacco: Never  Vaping Use   Vaping Use: Never used  Substance and Sexual Activity   Alcohol use: No   Drug use: No   Sexual activity: Not on file  Other Topics Concern   Not on file  Social History Narrative   Lives at home - in studio apartment behind daughters house   Has macular degeneration, so she cannot  drive - daughter helps her out a lot, but she tries to be independent as much as she can   Social Determinants of Corporate investment banker Strain: Low Risk    Difficulty of Paying Living Expenses: Not hard at all  Food Insecurity: No Food Insecurity   Worried About Programme researcher, broadcasting/film/video in the Last Year: Never true   Barista in the Last Year: Never true  Transportation Needs: No Transportation Needs   Lack of Transportation (Medical): No   Lack of Transportation (Non-Medical): No  Physical Activity: Inactive   Days of Exercise per Week: 0 days   Minutes of Exercise per Session: 0 min  Stress: No Stress Concern Present   Feeling of Stress : Not at all  Social Connections: Moderately Integrated   Frequency of Communication with Friends and Family: More than three times a week   Frequency of Social Gatherings with Friends and Family: More than three times a week   Attends Religious Services: More than 4 times per year   Active Member of [employer] or Organizations: Yes   Attends BankerClub or Organization Meetings: 1 to 4 times per year   Marital Status: Widowed    Tobacco Counseling Counseling given: Not Answered   Clinical Intake:  Pre-visit preparation completed: Yes  Pain : No/denies pain     BMI - recorded: 30.29 Nutritional Status: BMI > 30  Obese Nutritional Risks: None Diabetes: No  How often do you need to have someone help you when you read instructions, pamphlets, or other written materials from your doctor or pharmacy?: 1 - Never  Diabetic? No  Interpreter Needed?: No  Information entered by :: Oluchi Pucci, LPN   Activities of Daily Living In your present state of health, do you have any difficulty performing the following activities: 12/12/2020  Hearing? Y  Comment couldn't tolerate hearing aids  Vision? Y  Comment macular degeneration - tx didn't help  Difficulty concentrating or making decisions? Y  Comment a little  Walking or climbing stairs? Y   Dressing or bathing? N  Doing errands, shopping? Y  Comment cannot drive - daughter drives her  Quarry managerreparing Food and eating ? N  Using the Toilet? N  In the past six months, have you accidently leaked urine? Y  Comment wears pads for protection  Do you have problems with loss of bowel control? Y  Comment sometimes - has colitis  Managing your Medications? N  Managing your Finances? N  Housekeeping or managing your Housekeeping? N  Comment forgets to take maybe once a week  Some recent data might be hidden    Patient Care Team: Raliegh IpGottschalk, Ashly M, DO as PCP - General (Family Medicine)  Indicate any recent Medical Services you may have received from other than [facility] providers in the past year (date may be approximate).     Assessment:   This is a routine wellness examination for [place].  Hearing/Vision screen Hearing Screening - Comments:: C/o moderate hearing difficulties - she couldn't tolerate hearing aids, so took them back. Vision Screening - Comments:: Has macular degeneration - up to date with eye exams with Dr Allyne GeeSanders  Dietary issues and exercise activities discussed: Current Exercise Habits: The patient does not participate in regular exercise at present, Exercise limited by: orthopedic condition(s);cardiac condition(s);neurologic condition(s)   Goals Addressed             This Visit's Progress    Exercise 3x per week (30 min per time)         Depression Screen PHQ 2/9 Scores 12/12/2020 12/11/2020 04/23/2020 12/12/2019 11/09/2019 03/09/2014 12/19/2013  PHQ - 2 Score 2 4 0 2 2 1  0  PHQ- 9 Score 8 10 0 8 10 - -    Fall Risk Fall Risk  12/12/2020 12/11/2020 04/23/2020 01/23/2020 12/12/2019  Falls in the past year? 0 0 0 1 -  Number falls in past yr: 0 - - 1 -  Injury with Fall? 0 - - 1 1  Risk for fall due to : Orthopedic patient;Impaired vision - -  History of fall(s) History of fall(s)  Follow up Education provided;Falls prevention discussed - - Falls evaluation  completed Falls evaluation completed    FALL RISK PREVENTION PERTAINING TO THE HOME:  Any stairs in or around the home? No  If so, are there any without handrails? No  Home free of loose throw rugs in walkways, pet beds, electrical cords, etc? Yes  Adequate lighting in your home to reduce risk of falls? Yes   ASSISTIVE DEVICES UTILIZED TO PREVENT FALLS:  Life alert? No  Use of a cane, walker or w/c? No  Grab bars in the bathroom? Yes  Shower chair or bench in shower? Yes  Elevated toilet seat or a handicapped toilet? Yes   TIMED UP AND GO:  Was the test performed? No . Telephonic visit  Cognitive Function:     6CIT Screen 12/12/2020  What Year? 0 points  What month? 0 points  What time? 0 points  Count back from 20 0 points  Months in reverse 0 points  Repeat phrase 0 points  Total Score 0    Immunizations Immunization History  Administered Date(s) Administered   Influenza,inj,Quad PF,6+ Mos 04/14/2013, 03/15/2014   Influenza-Unspecified 03/25/2020   Moderna Sars-Covid-2 Vaccination 06/23/2019, 07/21/2019, 03/25/2020, 10/30/2020   Pneumococcal Conjugate-13 03/15/2014    TDAP status: Up to date  Flu Vaccine status: Up to date  Pneumococcal vaccine status: Up to date  Covid-19 vaccine status: Completed vaccines  Qualifies for Shingles Vaccine? Yes   Zostavax completed Yes   Shingrix Completed?: No.    Education has been provided regarding the importance of this vaccine. Patient has been advised to call insurance company to determine out of pocket expense if they have not yet received this vaccine. Advised may also receive vaccine at local pharmacy or Health Dept. Verbalized acceptance and understanding.  Screening Tests Health Maintenance  Topic Date Due   Zoster Vaccines- Shingrix (1 of 2) 03/13/2021 (Originally 04/07/1988)   DEXA SCAN  12/11/2021 (Originally 04/08/2003)   TETANUS/TDAP  12/11/2021 (Originally 04/07/1955)   PNA vac Low Risk Adult (2 of 2 -  PPSV23) 12/11/2021 (Originally 03/16/2015)   INFLUENZA VACCINE  12/23/2020   COVID-19 Vaccine  Completed   HPV VACCINES  Aged Out    Health Maintenance  There are no preventive care reminders to display for this patient.  Colorectal cancer screening: No longer required.   Mammogram status: No longer required due to age.  Bone Density Scan: due - declined  Lung Cancer Screening: (Low Dose CT Chest recommended if Age 76-80 years, 30 pack-year currently smoking OR have quit w/in 15years.) does not qualify.   Additional Screening:  Hepatitis C Screening: does not qualify  Vision Screening: Recommended annual ophthalmology exams for early detection of glaucoma and other disorders of the eye. Is the patient up to date with their annual eye exam?  Yes  Who is the provider or what is the name of the office in which the patient attends annual eye exams? Allyne Gee If pt is not established with a provider, would they like to be referred to a provider to establish care? No .   Dental Screening: Recommended annual dental exams for proper oral hygiene  Community Resource Referral / Chronic Care Management: CRR required this visit?  No   CCM required this visit?  No      Plan:     I have personally reviewed and noted the following in the patient's chart:   Medical and social history Use of alcohol,  tobacco or illicit drugs  Current medications and supplements including opioid prescriptions.  Functional ability and status Nutritional status Physical activity Advanced directives List of other physicians Hospitalizations, surgeries, and ER visits in previous 12 months Vitals Screenings to include cognitive, depression, and falls Referrals and appointments  In addition, I have reviewed and discussed with patient certain preventive protocols, quality metrics, and best practice recommendations. A written personalized care plan for preventive services as well as general preventive  health recommendations were provided to patient.     [place] Constable, LPN   [id number]/90/[id number]   Nurse Notes: None

## 2020-12-12 NOTE — Patient Instructions (Signed)
Ms. Kelsey Mills , Thank you for taking time to come for your Medicare Wellness Visit. I appreciate your ongoing commitment to your health goals. Please review the following plan we discussed and let me know if I can assist you in the future.   Screening recommendations/referrals: Colonoscopy: No longer required Mammogram: No longer required - Still recommended every year Bone Density: Due. Recommended every 2 years Recommended yearly ophthalmology/optometry visit for glaucoma screening and checkup Recommended yearly dental visit for hygiene and checkup  Vaccinations: Influenza vaccine: Done 03/25/2020 - Repeat annually Pneumococcal vaccine: Done 03/15/2014, may have had Pnuemovax in [place] Tdap vaccine: Due. Every 10 years Shingles vaccine: Due. Shingrix discussed. Please contact your pharmacy for coverage information.     Covid-19: Done 06/23/19, 07/21/19, 03/25/20, & 10/30/20  Advanced directives: Please bring a copy of your health care power of attorney and living will to the office to be added to your chart at your convenience.   Conditions/risks identified: Aim for 30 minutes of exercise walking each day, drink 6-8 glasses of water and eat lots of fruits and vegetables. Consider using a cane on days you feel weak or off balance.  Next appointment: Follow up in one year for your annual wellness visit    Preventive Care 65 Years and Older, Female Preventive care refers to lifestyle choices and visits with your health care provider that can promote health and wellness. What does preventive care include? A yearly physical exam. This is also called an annual well check. Dental exams once or twice a year. Routine eye exams. Ask your health care provider how often you should have your eyes checked. Personal lifestyle choices, including: Daily care of your teeth and gums. Regular physical activity. Eating a healthy diet. Avoiding tobacco and drug use. Limiting alcohol use. Practicing safe  sex. Taking low-dose aspirin every day. Taking vitamin and mineral supplements as recommended by your health care provider. What happens during an annual well check? The services and screenings done by your health care provider during your annual well check will depend on your age, overall health, lifestyle risk factors, and family history of disease. Counseling  Your health care provider may ask you questions about your: Alcohol use. Tobacco use. Drug use. Emotional well-being. Home and relationship well-being. Sexual activity. Eating habits. History of falls. Memory and ability to understand (cognition). Work and work Astronomer. Reproductive health. Screening  You may have the following tests or measurements: Height, weight, and BMI. Blood pressure. Lipid and cholesterol levels. These may be checked every 5 years, or more frequently if you are over 6 years old. Skin check. Lung cancer screening. You may have this screening every year starting at age 20 if you have a 30-pack-year history of smoking and currently smoke or have quit within the past 15 years. Fecal occult blood test (FOBT) of the stool. You may have this test every year starting at age 47. Flexible sigmoidoscopy or colonoscopy. You may have a sigmoidoscopy every 5 years or a colonoscopy every 10 years starting at age 25. Hepatitis C blood test. Hepatitis B blood test. Sexually transmitted disease (STD) testing. Diabetes screening. This is done by checking your blood sugar (glucose) after you have not eaten for a while (fasting). You may have this done every 1-3 years. Bone density scan. This is done to screen for osteoporosis. You may have this done starting at age 26. Mammogram. This may be done every 1-2 years. Talk to your health care provider about how often you should have  regular mammograms. Talk with your health care provider about your test results, treatment options, and if necessary, the need for more  tests. Vaccines  Your health care provider may recommend certain vaccines, such as: Influenza vaccine. This is recommended every year. Tetanus, diphtheria, and acellular pertussis (Tdap, Td) vaccine. You may need a Td booster every 10 years. Zoster vaccine. You may need this after age 39. Pneumococcal 13-valent conjugate (PCV13) vaccine. One dose is recommended after age 59. Pneumococcal polysaccharide (PPSV23) vaccine. One dose is recommended after age 21. Talk to your health care provider about which screenings and vaccines you need and how often you need them. This information is not intended to replace advice given to you by your health care provider. Make sure you discuss any questions you have with your health care provider. Document Released: 06/07/2015 Document Revised: 01/29/2016 Document Reviewed: 03/12/2015 Elsevier Interactive Patient Education  2017 [employer] Prevention in the Home Falls can cause injuries. They can happen to people of all ages. There are many things you can do to make your home safe and to help prevent falls. What can I do on the outside of my home? Regularly fix the edges of walkways and driveways and fix any cracks. Remove anything that might make you trip as you walk through a door, such as a raised step or threshold. Trim any bushes or trees on the path to your home. Use bright outdoor lighting. Clear any walking paths of anything that might make someone trip, such as rocks or tools. Regularly check to see if handrails are loose or broken. Make sure that both sides of any steps have handrails. Any raised decks and porches should have guardrails on the edges. Have any leaves, snow, or ice cleared regularly. Use sand or salt on walking paths during winter. Clean up any spills in your garage right away. This includes oil or grease spills. What can I do in the bathroom? Use night lights. Install grab bars by the toilet and in the tub and shower.  Do not use towel bars as grab bars. Use non-skid mats or decals in the tub or shower. If you need to sit down in the shower, use a plastic, non-slip stool. Keep the floor dry. Clean up any water that spills on the floor as soon as it happens. Remove soap buildup in the tub or shower regularly. Attach bath mats securely with double-sided non-slip rug tape. Do not have throw rugs and other things on the floor that can make you trip. What can I do in the bedroom? Use night lights. Make sure that you have a light by your bed that is easy to reach. Do not use any sheets or blankets that are too big for your bed. They should not hang down onto the floor. Have a firm chair that has side arms. You can use this for support while you get dressed. Do not have throw rugs and other things on the floor that can make you trip. What can I do in the kitchen? Clean up any spills right away. Avoid walking on wet floors. Keep items that you use a lot in easy-to-reach places. If you need to reach something above you, use a strong step stool that has a grab bar. Keep electrical cords out of the way. Do not use floor polish or wax that makes floors slippery. If you must use wax, use non-skid floor wax. Do not have throw rugs and other things on the floor that  can make you trip. What can I do with my stairs? Do not leave any items on the stairs. Make sure that there are handrails on both sides of the stairs and use them. Fix handrails that are broken or loose. Make sure that handrails are as long as the stairways. Check any carpeting to make sure that it is firmly attached to the stairs. Fix any carpet that is loose or worn. Avoid having throw rugs at the top or bottom of the stairs. If you do have throw rugs, attach them to the floor with carpet tape. Make sure that you have a light switch at the top of the stairs and the bottom of the stairs. If you do not have them, ask someone to add them for you. What else  can I do to help prevent falls? Wear shoes that: Do not have high heels. Have rubber bottoms. Are comfortable and fit you well. Are closed at the toe. Do not wear sandals. If you use a stepladder: Make sure that it is fully opened. Do not climb a closed stepladder. Make sure that both sides of the stepladder are locked into place. Ask someone to hold it for you, if possible. Clearly mark and make sure that you can see: Any grab bars or handrails. First and last steps. Where the edge of each step is. Use tools that help you move around (mobility aids) if they are needed. These include: Canes. Walkers. Scooters. Crutches. Turn on the lights when you go into a dark area. Replace any light bulbs as soon as they burn out. Set up your furniture so you have a clear path. Avoid moving your furniture around. If any of your floors are uneven, fix them. If there are any pets around you, be aware of where they are. Review your medicines with your doctor. Some medicines can make you feel dizzy. This can increase your chance of falling. Ask your doctor what other things that you can do to help prevent falls. This information is not intended to replace advice given to you by your health care provider. Make sure you discuss any questions you have with your health care provider. Document Released: 03/07/2009 Document Revised: 10/17/2015 Document Reviewed: 06/15/2014 Elsevier Interactive Patient Education  2017 [employer].

## 2020-12-12 NOTE — Progress Notes (Signed)
Pt aware.

## 2021-01-08 ENCOUNTER — Ambulatory Visit: Payer: 59 | Admitting: Cardiology

## 2021-02-01 ENCOUNTER — Other Ambulatory Visit: Payer: Self-pay | Admitting: Family Medicine

## 2021-02-01 DIAGNOSIS — R197 Diarrhea, unspecified: Secondary | ICD-10-CM

## 2021-02-08 IMAGING — CT CT ANGIO CHEST
2 of 6 series · 19 of 36 positions shown · IV contrast (omnipaque)
Comparison: Chest radiograph dated 11/30/2019.

CLINICAL DATA: 83-year-old female with shortness of breath.

EXAM:
CT ANGIOGRAPHY CHEST WITH CONTRAST
TECHNIQUE: Multidetector CT imaging of the chest was performed using the
standard protocol during bolus administration of intravenous
contrast. Multiplanar CT image reconstructions and MIPs were
obtained to evaluate the vascular anatomy.
CONTRAST:  55mL OMNIPAQUE IOHEXOL 350 MG/ML SOLN

[Series 7: pe thins · axial · 0.59mm/px · z∈[-193,-2]mm · 18 of 305 slices shown]
[im 16/305  lung]
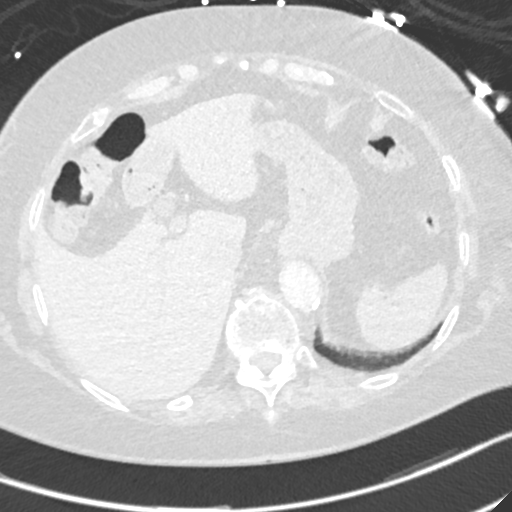
[im 31/305  mediastinal]
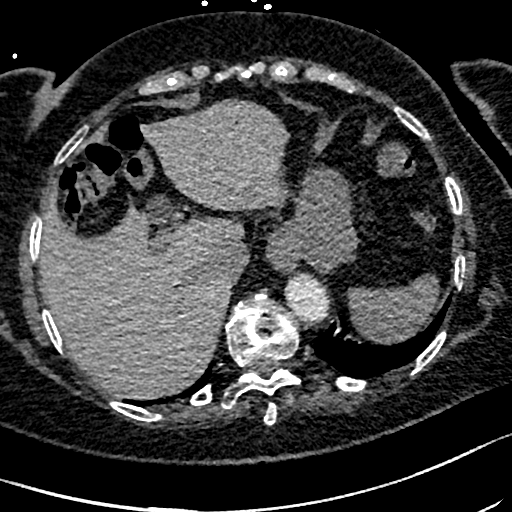
[im 46/305  lung]
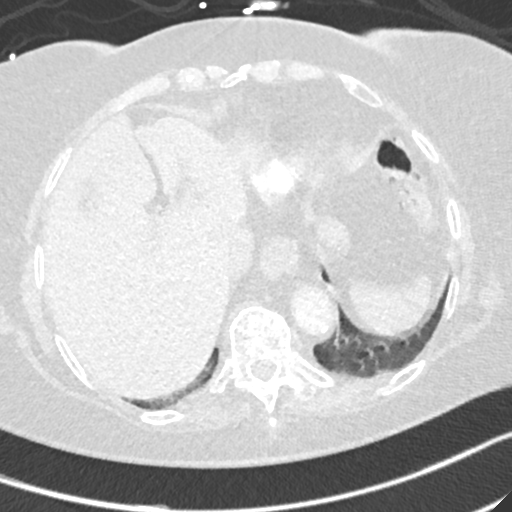
[im 61/305  mediastinal]
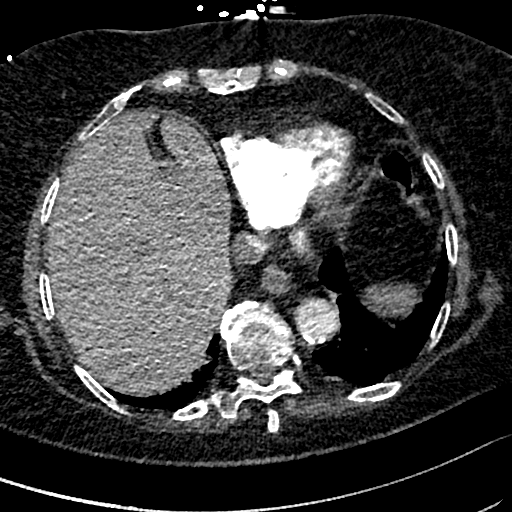
[im 77/305  lung]
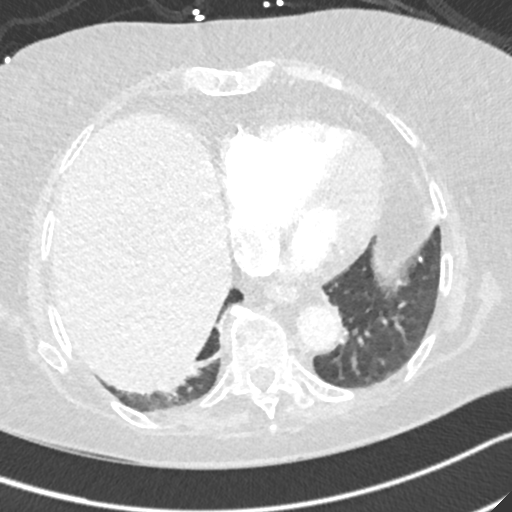
[im 92/305  mediastinal]
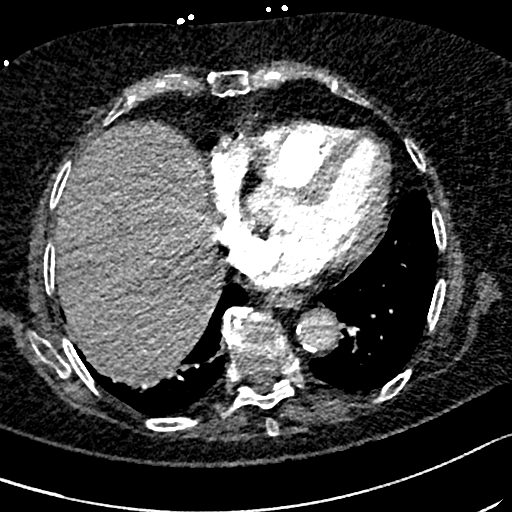
[im 107/305  lung]
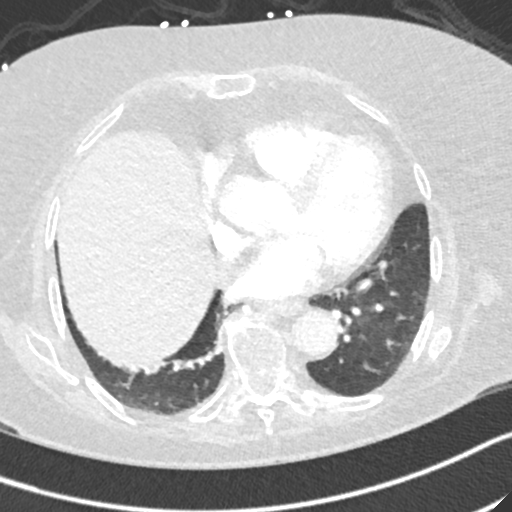
[im 122/305  mediastinal]
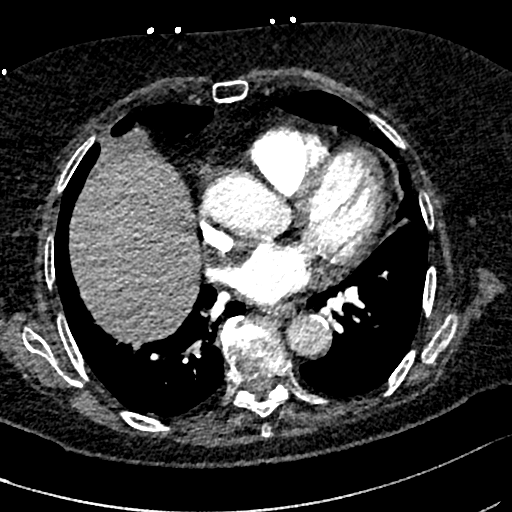
[im 137/305  lung]
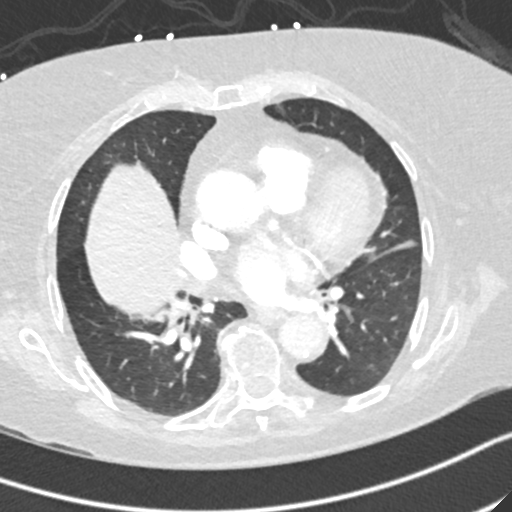
[im 168/305  mediastinal]
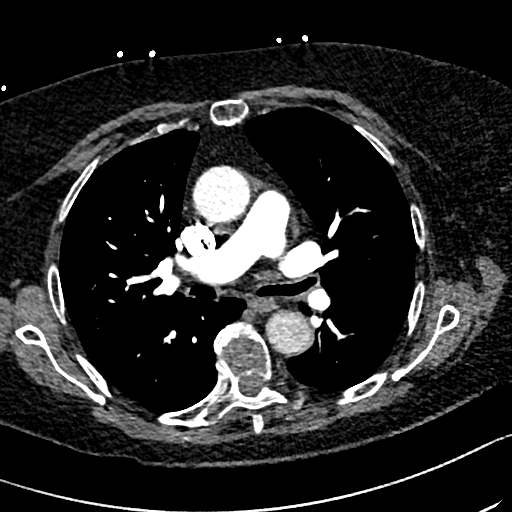
[im 183/305  lung]
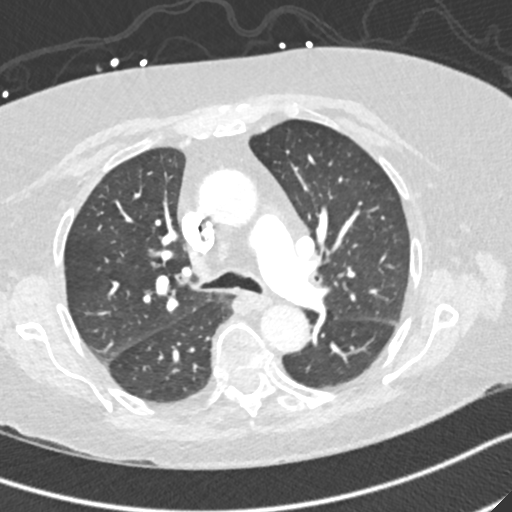
[im 198/305  mediastinal]
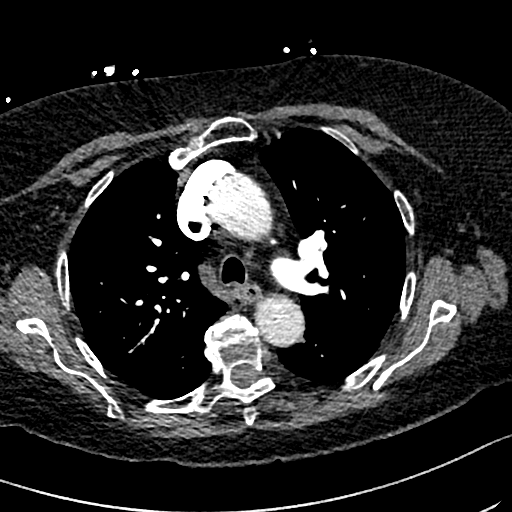
[im 213/305  lung]
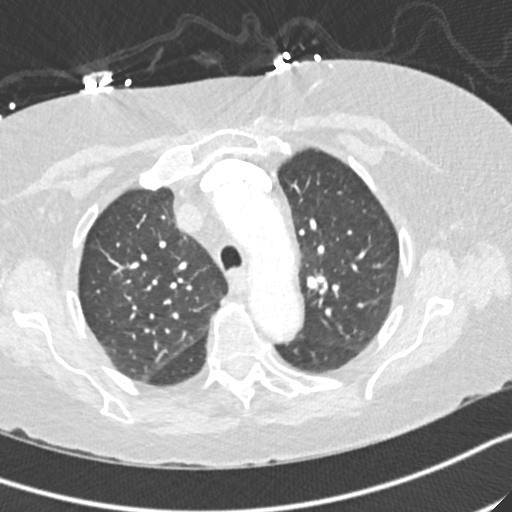
[im 229/305  mediastinal]
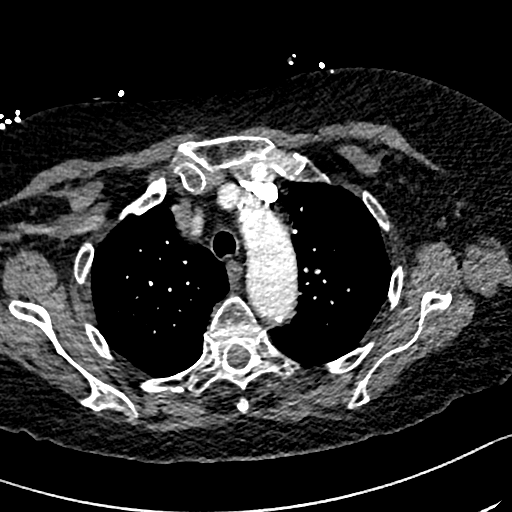
[im 244/305  lung]
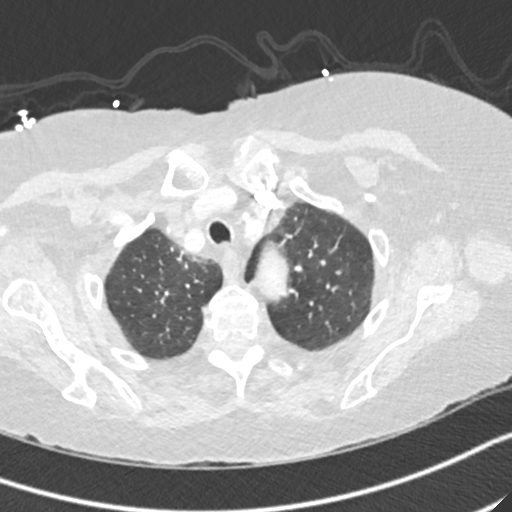
[im 259/305  mediastinal]
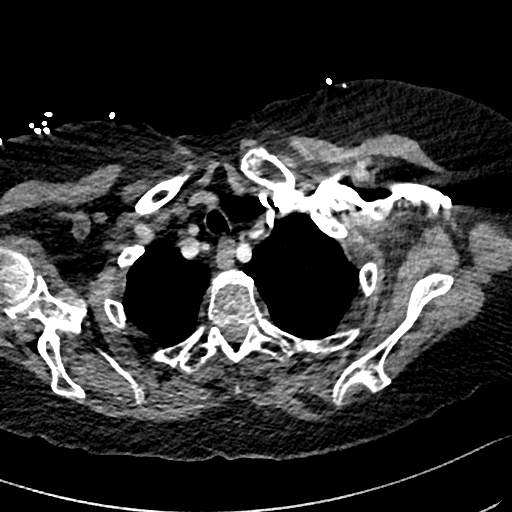
[im 274/305  lung]
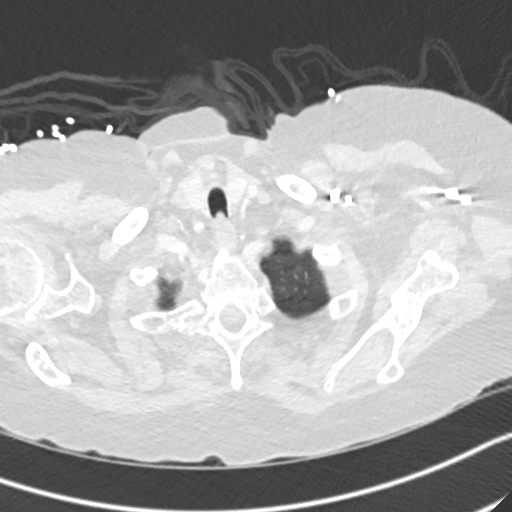
[im 289/305  mediastinal]
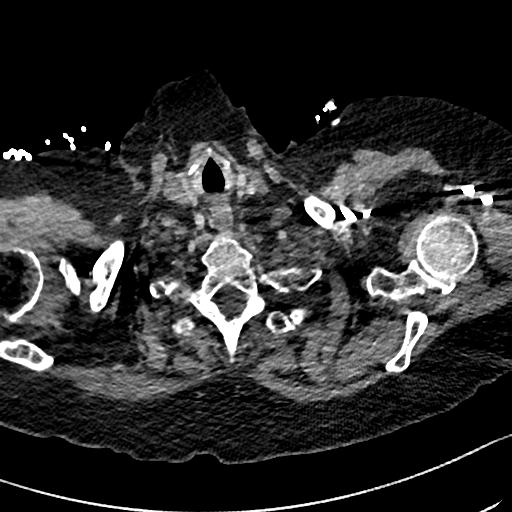

[Series 8: pe 2mm cor · coronal · 0.59mm/px · 1 of 120 slices shown]
[im 60/120  mediastinal]
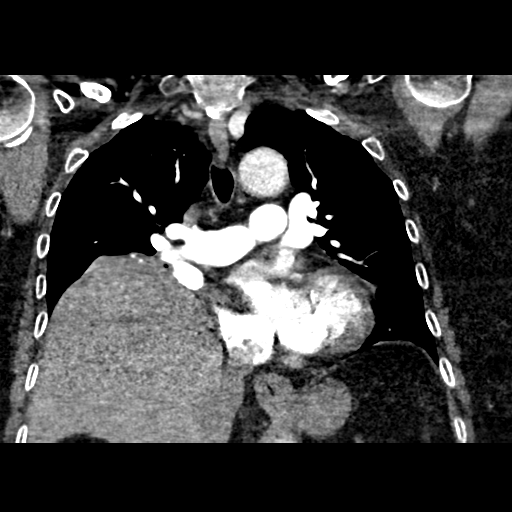

[19 of 36 positions shown; findings below may reference images not displayed]

FINDINGS: Cardiovascular: There is mild cardiomegaly. No pericardial effusion.
There is mild atherosclerotic calcification of the thoracic aorta.
No aneurysmal dilatation or dissection. No pulmonary artery embolus
identified.

Mediastinum/Nodes: There is no hilar or mediastinal adenopathy.
Small hiatal hernia. The esophagus is grossly unremarkable. No
mediastinal fluid collection.

Lungs/Pleura: There is mild eventration of the right hemidiaphragm
with right lung base linear and streaky atelectasis. No lobar
consolidation, pleural effusion, or pneumothorax. The central
airways are patent.

Upper Abdomen: Mildly dilated visualized common bile duct, possibly
related to cholecystectomy. Clinical correlation is recommended.

Musculoskeletal: Osteopenia with degenerative changes of the spine.
Age indeterminate compression fractures of T9-T11, likely acute or
subacute. Correlation with clinical exam and point tenderness
recommended. No retropulsed fragment.

Review of the MIP images confirms the above findings.
IMPRESSION: 1. No CT evidence of pulmonary embolism.
2. Age indeterminate compression fractures of T9-T11, likely acute
or subacute. Correlation with clinical exam and point tenderness
recommended.
3. Aortic Atherosclerosis ([id number]).

## 2021-03-31 ENCOUNTER — Other Ambulatory Visit: Payer: Self-pay | Admitting: Family Medicine

## 2021-04-07 ENCOUNTER — Other Ambulatory Visit: Payer: Self-pay | Admitting: Family Medicine

## 2021-05-27 ENCOUNTER — Telehealth: Payer: Self-pay | Admitting: Family Medicine

## 2021-05-27 NOTE — Telephone Encounter (Signed)
Pt's daughter calling to let us know that she needs all prescriptions sent to [facility] now instead of [facility] due to change in insurance. Please call pt back at [phone] if there are any questions or issues.

## 2021-05-27 NOTE — Telephone Encounter (Signed)
No answer, no vmail. Pt has not been seen since July 2021. Pt needed a follow up around 1/20. There are no appts scheduled.  Pt needs an appointment and can give refill until seen.

## 2021-06-13 ENCOUNTER — Encounter: Payer: Self-pay | Admitting: Family Medicine

## 2021-06-13 ENCOUNTER — Ambulatory Visit (INDEPENDENT_AMBULATORY_CARE_PROVIDER_SITE_OTHER): Payer: Medicare Other | Admitting: Family Medicine

## 2021-06-13 VITALS — BP 133/64 | HR 65 | Temp 98.5°F | Ht 63.0 in | Wt 177.8 lb

## 2021-06-13 DIAGNOSIS — I10 Essential (primary) hypertension: Secondary | ICD-10-CM

## 2021-06-13 DIAGNOSIS — D692 Other nonthrombocytopenic purpura: Secondary | ICD-10-CM | POA: Diagnosis not present

## 2021-06-13 DIAGNOSIS — Z23 Encounter for immunization: Secondary | ICD-10-CM | POA: Diagnosis not present

## 2021-06-13 DIAGNOSIS — M5412 Radiculopathy, cervical region: Secondary | ICD-10-CM

## 2021-06-13 DIAGNOSIS — M545 Low back pain, unspecified: Secondary | ICD-10-CM

## 2021-06-13 DIAGNOSIS — E039 Hypothyroidism, unspecified: Secondary | ICD-10-CM

## 2021-06-13 DIAGNOSIS — E538 Deficiency of other specified B group vitamins: Secondary | ICD-10-CM

## 2021-06-13 DIAGNOSIS — F418 Other specified anxiety disorders: Secondary | ICD-10-CM

## 2021-06-13 DIAGNOSIS — G8929 Other chronic pain: Secondary | ICD-10-CM

## 2021-06-13 MED ORDER — GABAPENTIN 100 MG PO CAPS: 100.0000 mg | ORAL_CAPSULE | Freq: Every evening | ORAL | 3 refills | Status: DC | PRN

## 2021-06-13 MED ORDER — NITROGLYCERIN 0.4 MG SL SUBL
0.4000 mg | SUBLINGUAL_TABLET | SUBLINGUAL | 6 refills | Status: DC | PRN
Start: 2021-06-13 — End: 2021-07-22

## 2021-06-13 MED ORDER — LISINOPRIL 5 MG PO TABS: 5.0000 mg | ORAL_TABLET | Freq: Every day | ORAL | 3 refills | Status: DC

## 2021-06-13 MED ORDER — LEVOTHYROXINE SODIUM 25 MCG PO TABS: 25.0000 ug | ORAL_TABLET | Freq: Every day | ORAL | 1 refills | Status: DC

## 2021-06-13 MED ORDER — ESCITALOPRAM OXALATE 10 MG PO TABS: 10.0000 mg | ORAL_TABLET | Freq: Every day | ORAL | 3 refills | Status: DC

## 2021-06-13 NOTE — Progress Notes (Signed)
Subjective: CC: Follow-up hypothyroidism, hypertension PCP: Janora Norlander, DO STM:[id number] Kelsey Mills is a 86 y.o. female presenting to clinic today for:  1.  Hypothyroidism Patient is compliant with Synthroid 25 mcg daily.  Denies any tremor, heart palpitations, change in bowel habits.  2.  Hypertension Patient is compliant with lisinopril 5 mg daily.  She is prescribed Lasix and potassium by her cardiologist and uses this daily with good urine output.  Denies any lower extremity edema.  She occasionally has chest pain.  However, its not sustained and she feels it is anginal in nature.  Her last nitroglycerin was in 2021 that she believes this to be expired.  She was supposed to follow-up 6 months after her February visit in 2022 with her heart doctor but has not yet done this.  3.  Neuropathy, back pain Patient suffers from chronic low back pain.  She does get occasional neuropathic pain down the left lower extremity.  The gabapentin initially was very helpful with this but has not been especially helpful as of late.  She takes 200 mg consistently at bedtime and has not tried escalating to 300 mg yet.  She also reports that her left upper extremity has been intermittently numb.  She denies any weakness or neck pain.   ROS: Per HPI  Allergies  Allergen Reactions   Tape Other (See Comments)    SKIN IS VERY THIN AND TEARS/BLEEDS VERY EASILY!!!!   Trazodone And Nefazodone Nausea And Vomiting   Mesalamine Rash   Past Medical History:  Diagnosis Date   Angina pectoris ([facility]) 11/22/2019   Arthritis    CAD (coronary artery disease) 12/22/2019   Depression    Depression with anxiety 11/09/2019   DVT (deep venous thrombosis) (HCC)    Essential hypertension 11/22/2019   Generalized edema 11/09/2019   GERD (gastroesophageal reflux disease)    Heart disease    Hyperlipidemia    Hypertension    Insomnia    Lymphocytic colitis    Noninfectious gastroenteritis and colitis 01/12/2013    Pedal edema 11/22/2019   Spinal stenosis, lumbar    Thyroid disease    Weight gain 01/16/2014    Current Outpatient Medications:    acetaminophen (TYLENOL) 650 MG CR tablet, Take 1,300 mg by mouth in the morning., Disp: , Rfl:    aspirin EC 81 MG tablet, Take 1 tablet (81 mg total) by mouth daily. Swallow whole., Disp: 90 tablet, Rfl: 3   Cholecalciferol (VITAMIN D3) 20 MCG (800 UNIT) TABS, Take 1 tablet by mouth daily. Except for Saturday and Sunday, Disp: , Rfl:    escitalopram (LEXAPRO) 10 MG tablet, Take 1 tablet (10 mg total) by mouth daily., Disp: 90 tablet, Rfl: 3   furosemide (LASIX) 40 MG tablet, Take 80 mg by mouth daily., Disp: , Rfl:    gabapentin (NEURONTIN) 100 MG capsule, Take 1-3 capsules (100-300 mg total) by mouth at bedtime as needed (pain)., Disp: 90 capsule, Rfl: 12   levothyroxine (SYNTHROID) 25 MCG tablet, TAKE 1 TABLET DAILY BEFORE BREAKFAST, Disp: 90 tablet, Rfl: 1   lisinopril (ZESTRIL) 5 MG tablet, TAKE 1 TABLET DAILY, Disp: 90 tablet, Rfl: 0   loperamide (IMODIUM) 2 MG capsule, TAKE 1 CAPSULE (2 MG TOTAL) BY MOUTH DAILY AS NEEDED FOR DIARRHEA OR LOOSE STOOLS., Disp: 30 capsule, Rfl: 5   Multiple Vitamins-Minerals (PRESERVISION AREDS PO), Take 1 capsule by mouth daily. , Disp: , Rfl:    potassium chloride SA (KLOR-CON) 20 MEQ tablet, Take 20 mEq by  mouth daily., Disp: , Rfl:    nitroGLYCERIN (NITROSTAT) 0.4 MG SL tablet, Place 1 tablet (0.4 mg total) under the tongue every 5 (five) minutes as needed. (Patient not taking: Reported on 06/13/2021), Disp: 25 tablet, Rfl: 6 Social History   Socioeconomic History   Marital status: Widowed    Spouse name: Not on file   Number of children: 4   Years of education: Not on file   Highest education level: Not on file  Occupational History   Occupation: retired  Tobacco Use   Smoking status: Never   Smokeless tobacco: Never  Vaping Use   Vaping Use: Never used  Substance and Sexual Activity   Alcohol use: No   Drug use:  No   Sexual activity: Not on file  Other Topics Concern   Not on file  Social History Narrative   Lives at home - in studio apartment behind daughters house   Has macular degeneration, so she cannot drive - daughter helps her out a lot, but she tries to be independent as much as she can   Social Determinants of Radio broadcast assistant Strain: Low Risk    Difficulty of Paying Living Expenses: Not hard at all  Food Insecurity: No Food Insecurity   Worried About Charity fundraiser in the Last Year: Never true   Arboriculturist in the Last Year: Never true  Transportation Needs: No Transportation Needs   Lack of Transportation (Medical): No   Lack of Transportation (Non-Medical): No  Physical Activity: Inactive   Days of Exercise per Week: 0 days   Minutes of Exercise per Session: 0 min  Stress: No Stress Concern Present   Feeling of Stress : Not at all  Social Connections: Moderately Integrated   Frequency of Communication with Friends and Family: More than three times a week   Frequency of Social Gatherings with Friends and Family: More than three times a week   Attends Religious Services: More than 4 times per year   Active Member of [employer] or Organizations: Yes   Attends Archivist Meetings: 1 to 4 times per year   Marital Status: Widowed  Human resources officer Violence: Not At Risk   Fear of Current or Ex-Partner: No   Emotionally Abused: No   Physically Abused: No   Sexually Abused: No   Family History  Problem Relation Age of Onset   Heart attack Mother        21   Diabetes Mother    Other Father        black lung    Heart disease Father    Heart disease Sister        stents    Heart disease Brother    Other Brother        parkinson    COPD Daughter    Diabetes Daughter    Heart disease Daughter    Hypertension Daughter    Hyperlipidemia Daughter    Thyroid disease Daughter    COPD Son    Heart disease Son    Hypertension Son    Hyperlipidemia Son     Arthritis Son        rheum    AAA (abdominal aortic aneurysm) Brother    COPD Daughter    Heart disease Daughter    Hypertension Daughter    Thyroid disease Daughter    Arthritis Daughter        rheum    Heart disease Daughter  Heart attack Daughter    Hyperlipidemia Daughter    Thyroid disease Daughter     Objective: Office vital signs reviewed. BP 133/64    Pulse 65    Temp 98.5 F (36.9 C)    Ht _0  (1.6 m)    Wt 177 lb 12.8 oz (80.6 kg)    SpO2 97%    BMI 31.50 kg/m   Physical Examination:  General: Awake, alert, well nourished, No acute distress HEENT: Sclera white.  No exophthalmos.  No goiter Cardio: regular rate and rhythm, S1S2 heard, no murmurs appreciated Pulm: clear to auscultation bilaterally, no wheezes, rhonchi or rales; normal work of breathing on room air Extremities: warm, well perfused, No edema, cyanosis or clubbing; +2 pulses bilaterally MSK: Ambulates with use of cane.  Lumbar spine without midline tenderness palpation. Neuro: Vibratory sensation intact.  Light touch sensation intact to the upper extremities  Assessment/ Plan: 86 y.o. female   Acquired hypothyroidism - Plan: TSH, T4, Free  Purpura senilis (HCC) - Plan: CMP14+EGFR  Essential hypertension - Plan: CMP14+EGFR  Chronic bilateral low back pain without sciatica  Vitamin B12 deficiency - Plan: Vitamin B12  Cervical radiculopathy  Depression with anxiety - Plan: escitalopram (LEXAPRO) 10 MG tablet  Asymptomatic from a thyroid standpoint.  Check TSH, free T4  Progressively less improving now that off of anticoagulation.  Check CMP  Blood pressure well controlled.  No changes needed  Continue as needed Tylenol, stretching.  Exam demonstrated nothing to suggest occult fracture  Patient is technically borderline B12 deficient but I did advise her to start oral B12 given complaints of neuropathy and last level being 232.  Have given her home stretches for her neck.  I suspect  that the left upper extremity intermittent numbness and tingling is likely related to a radiculopathy.  Offered referral and/or muscle relaxer but she declined this today.  No orders of the defined types were placed in this encounter.  No orders of the defined types were placed in this encounter.  Pneumonia vaccine administered today  Janora Norlander, [place] [phone]

## 2021-06-13 NOTE — Patient Instructions (Signed)
You had labs performed today.  You will be contacted with the results of the labs once they are available, usually in the next 3 business days for routine lab work.  If you have an active my chart account, they will be released to your MyChart.  If you prefer to have these labs released to you via telephone, please let us know.     You received pneumonia shot today.  This should be the last you ever have to take.  Your B12 level was on the low end of normal, start over the counter B12 supplement.  This should help with some of the neuropathy  I think your left intermittent arm numbness is from your neck.  I've given you some stretches to start doing.  Cervical Radiculopathy Cervical radiculopathy means that a nerve in the neck (a cervical nerve) is pinched or bruised. This can happen because of an injury to the cervical spine (vertebrae) in the neck, or as a normal part of getting older. This condition can cause pain or loss of feeling (numbness) that runs from your neck all the way down to your arm and fingers. Often, this condition gets better with rest. Treatment may be needed if the condition does not get better. What are the causes? A neck injury. A bulging disk in your spine. Sudden muscle tightening (muscle spasms). Tight muscles in your neck due to overuse. Arthritis. Breakdown in the bones and joints of the spine (spondylosis) due to getting older. Bone spurs that form near the nerves in the neck. What are the signs or symptoms? Pain. The pain may: Run from the neck to the arm and hand. Be very bad or irritating. Get worse when you move your neck. Loss of feeling or tingling in your arm or hand. Weakness in your arm or hand, in very bad cases. How is this treated? In many cases, treatment is not needed for this condition. With rest, the condition often gets better over time. If treatment is needed, options may include: Wearing a soft neck collar (cervical collar) for short  periods of time. Doing exercises (physical therapy) to strengthen your neck muscles. Taking medicines. Having shots (injections) in your spine, in very bad cases. Having surgery. This may be needed if other treatments do not help. The type of surgery that is used will depend on the cause of your condition. Follow these instructions at home: If you have a soft neck collar: Wear it as told by your doctor. Take it off only as told by your doctor. Ask your doctor if you can take the collar off for cleaning and bathing. If you are allowed to take the collar off for cleaning or bathing: Follow instructions from your doctor about how to take off the collar safely. Clean the collar by wiping it with mild soap and water and drying it completely. Take out any removable pads in the collar every 1-2 days. Wash them by hand with soap and water. Let them air-dry completely before you put them back in the collar. Check your skin under the collar for redness or sores. If you see any, tell your doctor. Managing pain   Take over-the-counter and prescription medicines only as told by your doctor. If told, put ice on the painful area. To do this: If you have a soft neck collar, take if off as told by your doctor. Put ice in a plastic bag. Place a towel between your skin and the bag. Leave the ice on for  20 minutes, 2-3 times a day. Take off the ice if your skin turns bright red. This is very important. If you cannot feel pain, heat, or cold, you have a greater risk of damage to the area. If using ice does not help, you can try using heat. Use the heat source that your doctor recommends, such as a moist heat pack or a heating pad. Place a towel between your skin and the heat source. Leave the heat on for 20-30 minutes. Take off the heat if your skin turns bright red. This is very important. If you cannot feel pain, heat, or cold, you have a greater risk of getting burned. You may try a gentle neck and shoulder  rub (massage). Activity Rest as needed. Return to your normal activities when your doctor says that it is safe. Do exercises as told by your doctor or physical therapist. You may have to avoid lifting. Ask your doctor how much you can safely lift. General instructions Use a flat pillow when you sleep. Do not drive while wearing a soft neck collar. If you do not have a soft neck collar, ask your doctor if it is safe to drive while your neck heals. Ask your doctor if you should avoid driving or using machines while you are taking your medicine. Do not smoke or use any products that contain nicotine or tobacco. If you need help quitting, ask your doctor. Keep all follow-up visits. Contact a doctor if: Your condition does not get better with treatment. Get help right away if: Your pain gets worse and medicine does not help. You lose feeling or feel weak in your hand, arm, face, or leg. You have a high fever. Your neck is stiff. You cannot control when you poop or pee (have incontinence). You have trouble with walking, balance, or talking. Summary Cervical radiculopathy means that a nerve in the neck is pinched or bruised. A nerve can get pinched from a bulging disk, arthritis, an injury to the neck, or other causes. Symptoms include pain, tingling, or loss of feeling that goes from the neck to the arm or hand. Weakness in your arm or hand can happen in very bad cases. Treatment may include resting, wearing a soft neck collar, and doing exercises. You might need to take medicines for pain. In very bad cases, shots or surgery may be needed. This information is not intended to replace advice given to you by your health care provider. Make sure you discuss any questions you have with your health care provider. Document Revised: 11/14/2020 Document Reviewed: 11/14/2020 Elsevier Patient Education  2022 [employer].

## 2021-06-13 NOTE — Addendum Note (Signed)
Addended by: Everlean Cherry on: 06/13/2021 11:37 AM   Modules accepted: Orders

## 2021-06-14 LAB — CMP14+EGFR
ALT: 11 IU/L (ref 0–32)
AST: 18 IU/L (ref 0–40)
Albumin/Globulin Ratio: 1.8 (ref 1.2–2.2)
Albumin: 4 g/dL (ref 3.6–4.6)
Alkaline Phosphatase: 109 IU/L (ref 44–121)
BUN/Creatinine Ratio: 17 (ref 12–28)
BUN: 20 mg/dL (ref 8–27)
Bilirubin Total: 0.6 mg/dL (ref 0.0–1.2)
CO2: 26 mmol/L (ref 20–29)
Calcium: 9.1 mg/dL (ref 8.7–10.3)
Chloride: 102 mmol/L (ref 96–106)
Creatinine, Ser: 1.16 mg/dL — ABNORMAL HIGH (ref 0.57–1.00)
Globulin, Total: 2.2 g/dL (ref 1.5–4.5)
Glucose: 101 mg/dL — ABNORMAL HIGH (ref 70–99)
Potassium: 4.4 mmol/L (ref 3.5–5.2)
Sodium: 140 mmol/L (ref 134–144)
Total Protein: 6.2 g/dL (ref 6.0–8.5)
eGFR: 47 mL/min/{1.73_m2} — ABNORMAL LOW (ref >59)

## 2021-06-14 LAB — T4, FREE: Free T4: 1.43 ng/dL (ref 0.82–1.77)

## 2021-06-14 LAB — VITAMIN B12: Vitamin B-12: 199 pg/mL — ABNORMAL LOW (ref 232–1245)

## 2021-06-14 LAB — TSH: TSH: 3.51 u[IU]/mL (ref 0.450–4.500)

## 2021-06-16 ENCOUNTER — Ambulatory Visit: Payer: Medicare Other

## 2021-06-18 ENCOUNTER — Ambulatory Visit (INDEPENDENT_AMBULATORY_CARE_PROVIDER_SITE_OTHER): Payer: Medicare Other

## 2021-06-18 DIAGNOSIS — E538 Deficiency of other specified B group vitamins: Secondary | ICD-10-CM | POA: Diagnosis not present

## 2021-06-18 MED ORDER — CYANOCOBALAMIN 1000 MCG/ML IJ SOLN
1000.0000 ug | Freq: Once | INTRAMUSCULAR | Status: AC
  Administered 2021-06-18: 16:00:00 1000 ug via INTRAMUSCULAR

## 2021-06-18 NOTE — Progress Notes (Signed)
Cyanocobalamin injection given to left deltoid.  Patient tolerated well.  Patient will call to schedule 2nd weekly injection.

## 2021-06-20 ENCOUNTER — Other Ambulatory Visit: Payer: Self-pay | Admitting: *Deleted

## 2021-06-20 DIAGNOSIS — R197 Diarrhea, unspecified: Secondary | ICD-10-CM

## 2021-06-20 MED ORDER — FUROSEMIDE 40 MG PO TABS
80.0000 mg | ORAL_TABLET | Freq: Every day | ORAL | 2 refills | Status: DC
Start: 2021-06-20 — End: 2022-01-10

## 2021-06-20 MED ORDER — LOPERAMIDE HCL 2 MG PO CAPS: 2.0000 mg | ORAL_CAPSULE | Freq: Every day | ORAL | 5 refills | Status: DC | PRN

## 2021-07-08 ENCOUNTER — Ambulatory Visit (INDEPENDENT_AMBULATORY_CARE_PROVIDER_SITE_OTHER): Payer: Medicare Other | Admitting: *Deleted

## 2021-07-08 DIAGNOSIS — E538 Deficiency of other specified B group vitamins: Secondary | ICD-10-CM | POA: Diagnosis not present

## 2021-07-08 MED ORDER — CYANOCOBALAMIN 1000 MCG/ML IJ SOLN
1000.0000 ug | Freq: Once | INTRAMUSCULAR | Status: AC
  Administered 2021-07-08: 1000 ug via INTRAMUSCULAR

## 2021-07-08 NOTE — Progress Notes (Signed)
Patient in today for weekly B12 injection. 1000 mcg given in left deltoid. Patient tolerated well.

## 2021-07-18 ENCOUNTER — Ambulatory Visit (INDEPENDENT_AMBULATORY_CARE_PROVIDER_SITE_OTHER): Payer: Medicare Other | Admitting: *Deleted

## 2021-07-18 DIAGNOSIS — E538 Deficiency of other specified B group vitamins: Secondary | ICD-10-CM | POA: Diagnosis not present

## 2021-07-18 MED ORDER — CYANOCOBALAMIN 1000 MCG/ML IJ SOLN
1000.0000 ug | Freq: Once | INTRAMUSCULAR | Status: AC
  Administered 2021-07-18: 1000 ug via INTRAMUSCULAR

## 2021-07-18 NOTE — Progress Notes (Signed)
Pt given b12inj IM on left arm per pt request. Pt tol well

## 2021-07-22 ENCOUNTER — Other Ambulatory Visit: Payer: Self-pay | Admitting: Family Medicine

## 2021-07-25 ENCOUNTER — Ambulatory Visit (INDEPENDENT_AMBULATORY_CARE_PROVIDER_SITE_OTHER): Payer: Medicare Other

## 2021-07-25 DIAGNOSIS — E538 Deficiency of other specified B group vitamins: Secondary | ICD-10-CM

## 2021-07-25 MED ORDER — CYANOCOBALAMIN 1000 MCG/ML IJ SOLN
1000.0000 ug | Freq: Once | INTRAMUSCULAR | Status: AC
  Administered 2021-07-25: 1000 ug via INTRAMUSCULAR

## 2021-07-25 NOTE — Progress Notes (Signed)
Pt given b12inj IM on r-deltoid. Pt tol tx well ?

## 2021-08-25 ENCOUNTER — Ambulatory Visit (INDEPENDENT_AMBULATORY_CARE_PROVIDER_SITE_OTHER): Payer: Medicare Other | Admitting: *Deleted

## 2021-08-25 DIAGNOSIS — E538 Deficiency of other specified B group vitamins: Secondary | ICD-10-CM

## 2021-08-25 MED ORDER — CYANOCOBALAMIN 1000 MCG/ML IJ SOLN
1000.0000 ug | Freq: Once | INTRAMUSCULAR | Status: AC
  Administered 2021-08-25: 1000 ug via INTRAMUSCULAR

## 2021-08-25 NOTE — Progress Notes (Signed)
Vitamin b12 injection given and patient tolerated well.  

## 2021-09-15 ENCOUNTER — Encounter: Payer: Self-pay | Admitting: Family Medicine

## 2021-09-15 ENCOUNTER — Ambulatory Visit (INDEPENDENT_AMBULATORY_CARE_PROVIDER_SITE_OTHER): Payer: Medicare Other | Admitting: Family Medicine

## 2021-09-15 DIAGNOSIS — J0140 Acute pansinusitis, unspecified: Secondary | ICD-10-CM | POA: Diagnosis not present

## 2021-09-15 MED ORDER — AMOXICILLIN 875 MG PO TABS: 875.0000 mg | ORAL_TABLET | Freq: Two times a day (BID) | ORAL | 0 refills | Status: AC

## 2021-09-15 NOTE — Progress Notes (Signed)
? ?  Virtual Visit  Note ?Due to COVID-19 pandemic this visit was conducted virtually. This visit type was conducted due to national recommendations for restrictions regarding the COVID-19 Pandemic (e.g. social distancing, sheltering in place) in an effort to limit this patient's exposure and mitigate transmission in our community. All issues noted in this document were discussed and addressed.  A physical exam was not performed with this format. ? ?I connected with Jaylea Plourde on 09/15/21 at 1102 by telephone and verified that I am speaking with the correct person using two identifiers. Emalene Reisner is currently located at home and no one is currently with her during the visit. The provider, Gabriel Earing, FNP is located in their office at time of visit. ? ?I discussed the limitations, risks, security and privacy concerns of performing an evaluation and management service by telephone and the availability of in person appointments. I also discussed with the patient that there may be a patient responsible charge related to this service. The patient expressed understanding and agreed to proceed. ? ?CC: sinusitis ? ?History and Present Illness: ? ?HPI ?Kelsey Mills reports cough, sore throat, and congestion x 1 week. She is now having pain in her left ear and pressure around her cheeks. She has felt warm but does not have a thermometer to check her temperature. She denies shortness of breath or chest pain. She has taken Nyquil. Overall symptoms have been unchanged.  ? ? ? ?ROS ?As per HPI.  ? ?Observations/Objective: ?Alert and oriented x 3. Able to speak in full sentences without difficulty.  ? ? ?Assessment and Plan: ?Zahriah was seen today for sinusitis. ? ?Diagnoses and all orders for this visit: ? ?Acute non-recurrent pansinusitis ?Amoxil as below. Discussed symptomatic care and return precautions.  ?-     amoxicillin (AMOXIL) 875 MG tablet; Take 1 tablet (875 mg total) by mouth 2 (two) times daily for 10  days. ? ? ? ? ?Follow Up Instructions: ?As needed.  ? ?  ?I discussed the assessment and treatment plan with the patient. The patient was provided an opportunity to ask questions and all were answered. The patient agreed with the plan and demonstrated an understanding of the instructions. ?  ?The patient was advised to call back or seek an in-person evaluation if the symptoms worsen or if the condition fails to improve as anticipated. ? ?The above assessment and management plan was discussed with the patient. The patient verbalized understanding of and has agreed to the management plan. Patient is aware to call the clinic if symptoms persist or worsen. Patient is aware when to return to the clinic for a follow-up visit. Patient educated on when it is appropriate to go to the emergency department.  ? ?Time call ended:  1114 ? ?I provided 12 minutes of  non face-to-face time during this encounter. ? ? ? ?Gabriel Earing, FNP ? ? ?

## 2021-10-16 ENCOUNTER — Other Ambulatory Visit: Payer: Self-pay | Admitting: Family Medicine

## 2021-10-18 ENCOUNTER — Other Ambulatory Visit: Payer: Self-pay | Admitting: Family Medicine

## 2021-10-18 DIAGNOSIS — R197 Diarrhea, unspecified: Secondary | ICD-10-CM

## 2021-11-04 ENCOUNTER — Ambulatory Visit (INDEPENDENT_AMBULATORY_CARE_PROVIDER_SITE_OTHER): Payer: Medicare Other | Admitting: Family Medicine

## 2021-11-04 ENCOUNTER — Encounter: Payer: Self-pay | Admitting: Family Medicine

## 2021-11-04 ENCOUNTER — Other Ambulatory Visit: Payer: Self-pay | Admitting: Family Medicine

## 2021-11-04 ENCOUNTER — Ambulatory Visit (HOSPITAL_COMMUNITY)
Admission: RE | Admit: 2021-11-04 | Discharge: 2021-11-04 | Disposition: A | Discharge status: Home / Self Care | Payer: Medicare Other | Source: Ambulatory Visit | Attending: Family Medicine | Admitting: Family Medicine

## 2021-11-04 VITALS — BP 140/72 | HR 64 | Temp 98.0°F | Ht 63.0 in | Wt 182.0 lb

## 2021-11-04 DIAGNOSIS — A0472 Enterocolitis due to Clostridium difficile, not specified as recurrent: Secondary | ICD-10-CM | POA: Diagnosis not present

## 2021-11-04 DIAGNOSIS — R1084 Generalized abdominal pain: Secondary | ICD-10-CM | POA: Diagnosis not present

## 2021-11-04 DIAGNOSIS — R197 Diarrhea, unspecified: Secondary | ICD-10-CM | POA: Insufficient documentation

## 2021-11-04 MED ORDER — AMOXICILLIN-POT CLAVULANATE 875-125 MG PO TABS: 1.0000 | ORAL_TABLET | Freq: Two times a day (BID) | ORAL | 0 refills | Status: DC

## 2021-11-04 MED ORDER — IOHEXOL 300 MG/ML  SOLN
100.0000 mL | Freq: Once | INTRAMUSCULAR | Status: AC | PRN
  Administered 2021-11-04: 100 mL via INTRAVENOUS

## 2021-11-04 NOTE — Progress Notes (Signed)
BP 140/72   Pulse 64   Temp 98 F (36.7 C)   Ht 5\' 3"  (1.6 m)   Wt 182 lb (82.6 kg)   SpO2 95%   BMI 32.24 kg/m    Subjective:   Patient ID: Kelsey Mills, female    DOB: [birth year], 86 y.o.   MRN: [id number]  HPI: Kelsey Mills is a 86 y.o. female presenting on 11/04/2021 for Diarrhea (Watery x2 weeks. Denies bleeding or dark stools. H/O colitis. Dx years ago. Last colon over 10 year ago per pt. Did not need another per pt.)   HPI Patient has been having watery diarrhea this been going on for couple weeks.  She denies any bleeding or blood in her stool.  She does have abdominal pain that goes across her abdomen that started just recently over the past few days.  She denies any fevers or chills.  She says after she eats she will get the diarrhea and some nausea and indigestion.  She has had no vomiting.  She denies any urinary symptoms or blood in her urine.  The pain is worse in the upper part of this abdomen and the right upper part of the abdomen.  She says she got her gallbladder out many years ago.  Relevant past medical, surgical, family and social history reviewed and updated as indicated. Interim medical history since our last visit reviewed. Allergies and medications reviewed and updated.  Review of Systems  Constitutional:  Negative for chills and fever.  Eyes:  Negative for visual disturbance.  Respiratory:  Negative for chest tightness and shortness of breath.   Cardiovascular:  Negative for chest pain and leg swelling.  Gastrointestinal:  Positive for abdominal pain, diarrhea and nausea. Negative for blood in stool.  Genitourinary:  Negative for decreased urine volume, difficulty urinating, dysuria, hematuria, pelvic pain and urgency.  Musculoskeletal:  Negative for back pain and gait problem.  Skin:  Negative for rash.  Neurological:  Negative for light-headedness and headaches.  Psychiatric/Behavioral:  Negative for agitation and behavioral problems.   All  other systems reviewed and are negative.   Per HPI unless specifically indicated above   Allergies as of 11/04/2021       Reactions   Tape Other (See Comments)   SKIN IS VERY THIN AND TEARS/BLEEDS VERY EASILY!!!!   Trazodone And Nefazodone Nausea And Vomiting   Mesalamine Rash        Medication List        Accurate as of November 04, 2021  3:36 PM. If you have any questions, ask your nurse or doctor.          acetaminophen 650 MG CR tablet Commonly known as: TYLENOL Take 1,300 mg by mouth in the morning.   aspirin EC 81 MG tablet Take 1 tablet (81 mg total) by mouth daily. Swallow whole.   escitalopram 10 MG tablet Commonly known as: Lexapro Take 1 tablet (10 mg total) by mouth daily.   furosemide 40 MG tablet Commonly known as: LASIX Take 2 tablets (80 mg total) by mouth daily.   gabapentin 100 MG capsule Commonly known as: NEURONTIN Take 1-3 capsules (100-300 mg total) by mouth at bedtime as needed (pain).   levothyroxine 25 MCG tablet Commonly known as: SYNTHROID TAKE 1 TABLET BY MOUTH DAILY  BEFORE BREAKFAST   lisinopril 5 MG tablet Commonly known as: ZESTRIL Take 1 tablet (5 mg total) by mouth daily.   loperamide 2 MG capsule Commonly known as: IMODIUM TAKE 1 CAPSULE  BY MOUTH EVERY 6  HOURS AS NEEDED FOR DIARRHEA   nitroGLYCERIN 0.4 MG SL tablet Commonly known as: NITROSTAT DISSOLVE 1 TABLET UNDER THE  TONGUE EVERY 5 MINUTES AS NEEDED FOR CHEST PAIN. MAX OF 3 TABLETS IN 15 MINUTES. CALL 911 IF PAIN  PERSISTS.   potassium chloride SA 20 MEQ tablet Commonly known as: KLOR-CON M Take 20 mEq by mouth daily.   PRESERVISION AREDS PO Take 1 capsule by mouth daily.   Vitamin D3 20 MCG (800 UNIT) Tabs Take 1 tablet by mouth daily. Except for Saturday and Sunday         Objective:   BP 140/72   Pulse 64   Temp 98 F (36.7 C)   Ht 5\' 3"  (1.6 m)   Wt 182 lb (82.6 kg)   SpO2 95%   BMI 32.24 kg/m   Wt Readings from Last 3 Encounters:   11/04/21 182 lb (82.6 kg)  06/13/21 177 lb 12.8 oz (80.6 kg)  12/12/20 171 lb (77.6 kg)    Physical Exam Vitals and nursing note reviewed.  Constitutional:      Appearance: Normal appearance. She is obese.  Abdominal:     General: Abdomen is flat. Bowel sounds are normal. There is no distension.     Palpations: Abdomen is soft.     Tenderness: There is abdominal tenderness in the right upper quadrant, right lower quadrant and epigastric area. There is no right CVA tenderness, left CVA tenderness, guarding or rebound.     Hernia: No hernia is present.  Neurological:     Mental Status: She is alert.       Assessment & Plan:   Problem List Items Addressed This Visit   None Visit Diagnoses     Diarrhea, unspecified type    -  Primary   Relevant Orders   Cdiff NAA+O+P+Stool Culture   CT ABDOMEN PELVIS W CONTRAST   Generalized abdominal pain       Relevant Orders   Cdiff NAA+O+P+Stool Culture   CT ABDOMEN PELVIS W CONTRAST       Concern for diverticulitis versus possible C. difficile, has a family member who had C. difficile.  We will do stool studies and will do also do CT abdomen pelvis stat Follow up plan: Return if symptoms worsen or fail to improve.  Counseling provided for all of the vaccine components Orders Placed This Encounter  Procedures   Cdiff NAA+O+P+Stool Culture   CT ABDOMEN PELVIS W CONTRAST    12/14/20, MD [facility] 11/04/2021, 3:36 PM

## 2021-11-06 ENCOUNTER — Other Ambulatory Visit: Payer: Medicare Other

## 2021-11-10 LAB — CDIFF NAA+O+P+STOOL CULTURE
E coli, Shiga toxin Assay: NEGATIVE
Toxigenic C. Difficile by PCR: POSITIVE — AB

## 2021-11-11 ENCOUNTER — Other Ambulatory Visit: Payer: Self-pay | Admitting: Family Medicine

## 2021-11-11 ENCOUNTER — Telehealth: Payer: Self-pay | Admitting: Family Medicine

## 2021-11-11 DIAGNOSIS — A0472 Enterocolitis due to Clostridium difficile, not specified as recurrent: Secondary | ICD-10-CM

## 2021-11-11 MED ORDER — VANCOMYCIN HCL 125 MG PO CAPS: 125.0000 mg | ORAL_CAPSULE | Freq: Four times a day (QID) | ORAL | 0 refills | Status: DC

## 2021-11-11 NOTE — Progress Notes (Signed)
Reviewed labs with Mrs. Kelsey Mills's daughter, [name].  C. difficile positive.  Start vancomycin 125 mg 4 times daily.    Would like her to have a BMP collected Friday.  Simply to follow-up on her electrolyte and renal function given diffuse diarrhea  Follow-up if no improvement

## 2021-11-11 NOTE — Telephone Encounter (Signed)
I have sent in the vancomycin and let them know about the results

## 2021-11-14 ENCOUNTER — Other Ambulatory Visit: Payer: Medicare Other

## 2021-11-14 DIAGNOSIS — A0472 Enterocolitis due to Clostridium difficile, not specified as recurrent: Secondary | ICD-10-CM

## 2021-11-15 LAB — BASIC METABOLIC PANEL
BUN/Creatinine Ratio: 14 (ref 12–28)
BUN: 14 mg/dL (ref 8–27)
CO2: 26 mmol/L (ref 20–29)
Calcium: 8.9 mg/dL (ref 8.7–10.3)
Chloride: 104 mmol/L (ref 96–106)
Creatinine, Ser: 0.99 mg/dL (ref 0.57–1.00)
Glucose: 86 mg/dL (ref 70–99)
Potassium: 4 mmol/L (ref 3.5–5.2)
Sodium: 144 mmol/L (ref 134–144)
eGFR: 57 mL/min/{1.73_m2} — ABNORMAL LOW (ref >59)

## 2021-11-15 LAB — CBC
Hematocrit: 38.2 % (ref 34.0–46.6)
Hemoglobin: 12.5 g/dL (ref 11.1–15.9)
MCH: 32.1 pg (ref 26.6–33.0)
MCHC: 32.7 g/dL (ref 31.5–35.7)
MCV: 98 fL — ABNORMAL HIGH (ref 79–97)
Platelets: 255 10*3/uL (ref 150–450)
RBC: 3.9 x10E6/uL (ref 3.77–5.28)
RDW: 13.1 % (ref 11.7–15.4)
WBC: 8.3 10*3/uL (ref 3.4–10.8)

## 2021-12-15 ENCOUNTER — Ambulatory Visit (INDEPENDENT_AMBULATORY_CARE_PROVIDER_SITE_OTHER): Payer: Medicare Other

## 2021-12-15 ENCOUNTER — Other Ambulatory Visit: Payer: Self-pay | Admitting: Family Medicine

## 2021-12-15 ENCOUNTER — Telehealth: Payer: Self-pay

## 2021-12-15 VITALS — Wt 182.0 lb

## 2021-12-15 DIAGNOSIS — A0472 Enterocolitis due to Clostridium difficile, not specified as recurrent: Secondary | ICD-10-CM

## 2021-12-15 DIAGNOSIS — Z0000 Encounter for general adult medical examination without abnormal findings: Secondary | ICD-10-CM | POA: Diagnosis not present

## 2021-12-15 MED ORDER — VANCOMYCIN HCL 125 MG PO CAPS: 125.0000 mg | ORAL_CAPSULE | Freq: Four times a day (QID) | ORAL | 0 refills | Status: AC

## 2021-12-15 NOTE — Telephone Encounter (Signed)
Pt aware.

## 2021-12-15 NOTE — Telephone Encounter (Signed)
I've ordered another round of Vanc.  If this does not resolve it, she may need to go onto something called Deficid.  Unfortunately, it's super hard to find and can be quite costly.  Hopefully, she will clear with this repeat Vanc course

## 2021-12-15 NOTE — Patient Instructions (Signed)
Kelsey Mills , Thank you for taking time to come for your Medicare Wellness Visit. I appreciate your ongoing commitment to your health goals. Please review the following plan we discussed and let me know if I can assist you in the future.   Screening recommendations/referrals: Colonoscopy: No longer required Mammogram: No longer required Bone Density: No longer required Recommended yearly ophthalmology/optometry visit for glaucoma screening and checkup Recommended yearly dental visit for hygiene and checkup  Vaccinations: Influenza vaccine: Done 03/03/2021 - Repeat annually  Pneumococcal vaccine: Done  03/15/2014 & 06/13/2021 Tdap vaccine: Done per patient - repeat recommended every 10 years Shingles vaccine: Done per patient in 2020 x 2   Covid-19: Done 06/23/2019, 07/21/2019, 03/25/2020, & 10/30/2020  Advanced directives: Please bring a copy of your health care power of attorney and living will to the office to be added to your chart at your convenience.   Conditions/risks identified: Aim for 4-6 glasses of water, plenty of protein in your diet and try to get up and walk/ stretch every hour for 5-10 minutes at a time.   Next appointment: Follow up in one year for your annual wellness visit    Preventive Care 65 Years and Older, Female Preventive care refers to lifestyle choices and visits with your health care provider that can promote health and wellness. What does preventive care include? A yearly physical exam. This is also called an annual well check. Dental exams once or twice a year. Routine eye exams. Ask your health care provider how often you should have your eyes checked. Personal lifestyle choices, including: Daily care of your teeth and gums. Regular physical activity. Eating a healthy diet. Avoiding tobacco and drug use. Limiting alcohol use. Practicing safe sex. Taking low-dose aspirin every day. Taking vitamin and mineral supplements as recommended by your health  care provider. What happens during an annual well check? The services and screenings done by your health care provider during your annual well check will depend on your age, overall health, lifestyle risk factors, and family history of disease. Counseling  Your health care provider may ask you questions about your: Alcohol use. Tobacco use. Drug use. Emotional well-being. Home and relationship well-being. Sexual activity. Eating habits. History of falls. Memory and ability to understand (cognition). Work and work Astronomer. Reproductive health. Screening  You may have the following tests or measurements: Height, weight, and BMI. Blood pressure. Lipid and cholesterol levels. These may be checked every 5 years, or more frequently if you are over 74 years old. Skin check. Lung cancer screening. You may have this screening every year starting at age 44 if you have a 30-pack-year history of smoking and currently smoke or have quit within the past 15 years. Fecal occult blood test (FOBT) of the stool. You may have this test every year starting at age 76. Flexible sigmoidoscopy or colonoscopy. You may have a sigmoidoscopy every 5 years or a colonoscopy every 10 years starting at age 26. Hepatitis C blood test. Hepatitis B blood test. Sexually transmitted disease (STD) testing. Diabetes screening. This is done by checking your blood sugar (glucose) after you have not eaten for a while (fasting). You may have this done every 1-3 years. Bone density scan. This is done to screen for osteoporosis. You may have this done starting at age 68. Mammogram. This may be done every 1-2 years. Talk to your health care provider about how often you should have regular mammograms. Talk with your health care provider about your test results, treatment  options, and if necessary, the need for more tests. Vaccines  Your health care provider may recommend certain vaccines, such as: Influenza vaccine. This is  recommended every year. Tetanus, diphtheria, and acellular pertussis (Tdap, Td) vaccine. You may need a Td booster every 10 years. Zoster vaccine. You may need this after age 53. Pneumococcal 13-valent conjugate (PCV13) vaccine. One dose is recommended after age 5. Pneumococcal polysaccharide (PPSV23) vaccine. One dose is recommended after age 60. Talk to your health care provider about which screenings and vaccines you need and how often you need them. This information is not intended to replace advice given to you by your health care provider. Make sure you discuss any questions you have with your health care provider. Document Released: 06/07/2015 Document Revised: 01/29/2016 Document Reviewed: 03/12/2015 Elsevier Interactive Patient Education  2017 [employer].  Fall Prevention in the Home Falls can cause injuries. They can happen to people of all ages. There are many things you can do to make your home safe and to help prevent falls. What can I do on the outside of my home? Regularly fix the edges of walkways and driveways and fix any cracks. Remove anything that might make you trip as you walk through a door, such as a raised step or threshold. Trim any bushes or trees on the path to your home. Use bright outdoor lighting. Clear any walking paths of anything that might make someone trip, such as rocks or tools. Regularly check to see if handrails are loose or broken. Make sure that both sides of any steps have handrails. Any raised decks and porches should have guardrails on the edges. Have any leaves, snow, or ice cleared regularly. Use sand or salt on walking paths during winter. Clean up any spills in your garage right away. This includes oil or grease spills. What can I do in the bathroom? Use night lights. Install grab bars by the toilet and in the tub and shower. Do not use towel bars as grab bars. Use non-skid mats or decals in the tub or shower. If you need to sit down in  the shower, use a plastic, non-slip stool. Keep the floor dry. Clean up any water that spills on the floor as soon as it happens. Remove soap buildup in the tub or shower regularly. Attach bath mats securely with double-sided non-slip rug tape. Do not have throw rugs and other things on the floor that can make you trip. What can I do in the bedroom? Use night lights. Make sure that you have a light by your bed that is easy to reach. Do not use any sheets or blankets that are too big for your bed. They should not hang down onto the floor. Have a firm chair that has side arms. You can use this for support while you get dressed. Do not have throw rugs and other things on the floor that can make you trip. What can I do in the kitchen? Clean up any spills right away. Avoid walking on wet floors. Keep items that you use a lot in easy-to-reach places. If you need to reach something above you, use a strong step stool that has a grab bar. Keep electrical cords out of the way. Do not use floor polish or wax that makes floors slippery. If you must use wax, use non-skid floor wax. Do not have throw rugs and other things on the floor that can make you trip. What can I do with my stairs? Do not  leave any items on the stairs. Make sure that there are handrails on both sides of the stairs and use them. Fix handrails that are broken or loose. Make sure that handrails are as long as the stairways. Check any carpeting to make sure that it is firmly attached to the stairs. Fix any carpet that is loose or worn. Avoid having throw rugs at the top or bottom of the stairs. If you do have throw rugs, attach them to the floor with carpet tape. Make sure that you have a light switch at the top of the stairs and the bottom of the stairs. If you do not have them, ask someone to add them for you. What else can I do to help prevent falls? Wear shoes that: Do not have high heels. Have rubber bottoms. Are comfortable  and fit you well. Are closed at the toe. Do not wear sandals. If you use a stepladder: Make sure that it is fully opened. Do not climb a closed stepladder. Make sure that both sides of the stepladder are locked into place. Ask someone to hold it for you, if possible. Clearly mark and make sure that you can see: Any grab bars or handrails. First and last steps. Where the edge of each step is. Use tools that help you move around (mobility aids) if they are needed. These include: Canes. Walkers. Scooters. Crutches. Turn on the lights when you go into a dark area. Replace any light bulbs as soon as they burn out. Set up your furniture so you have a clear path. Avoid moving your furniture around. If any of your floors are uneven, fix them. If there are any pets around you, be aware of where they are. Review your medicines with your doctor. Some medicines can make you feel dizzy. This can increase your chance of falling. Ask your doctor what other things that you can do to help prevent falls. This information is not intended to replace advice given to you by your health care provider. Make sure you discuss any questions you have with your health care provider. Document Released: 03/07/2009 Document Revised: 10/17/2015 Document Reviewed: 06/15/2014 Elsevier Interactive Patient Education  2017 [employer].

## 2021-12-15 NOTE — Telephone Encounter (Signed)
Had C.Dif last month - finished all medication - still having diarrhea - went 13 times at least yesterday - 4 times already this morning - sometimes can't make it to the bathroom in time. Wants to know if she needs more antibiotics or what to do next? Thanks!

## 2021-12-15 NOTE — Progress Notes (Signed)
Subjective:   Kelsey Mills is a 86 y.o. female who presents for Medicare Annual (Subsequent) preventive examination.  Virtual Visit via Telephone Note  I connected with  Kelsey Mills on 12/15/21 at  1:15 PM EDT by telephone and verified that I am speaking with the correct person using two identifiers.  Location: Patient: Home Provider: [facility] Persons participating in the virtual visit: patient/Nurse Health Advisor   I discussed the limitations, risks, security and privacy concerns of performing an evaluation and management service by telephone and the availability of in person appointments. The patient expressed understanding and agreed to proceed.  Interactive audio and video telecommunications were attempted between this nurse and patient, however failed, due to patient having technical difficulties OR patient did not have access to video capability.  We continued and completed visit with audio only.  Some vital signs may be absent or patient reported.   Jarron Curley E Lisett Dirusso, LPN   Review of Systems     Cardiac Risk Factors include: advanced age (>58men, >43 women);dyslipidemia;hypertension;sedentary lifestyle;obesity (BMI >30kg/m2);Other (see comment), Risk factor comments: CAD, hx of DVT     Objective:    Today's Vitals   12/15/21 1318  Weight: 182 lb (82.6 kg)  PainSc: 6    Body mass index is 32.24 kg/m.     12/15/2021    1:44 PM 12/12/2020    2:50 PM 12/19/2019    7:39 PM 11/30/2019    3:21 PM 11/23/2019    7:20 AM  Advanced Directives  Does Patient Have a Medical Advance Directive? Yes Yes No No Yes  Type of Estate agent of [place];Living will Healthcare Power of [place];Living will Living will;Healthcare Power of Attorney Living will;Healthcare Power of Attorney Living will;Healthcare Power of Attorney  Does patient want to make changes to medical advance directive?   No - Patient declined No - Patient declined No - Patient declined  Copy of  Healthcare Power of Attorney in Chart? No - copy requested No - copy requested   No - copy requested  Would patient like information on creating a medical advance directive?   No - Guardian declined No - Patient declined     Current Medications (verified) Outpatient Encounter Medications as of 12/15/2021  Medication Sig   acetaminophen (TYLENOL) 650 MG CR tablet Take 1,300 mg by mouth in the morning.   aspirin EC 81 MG tablet Take 1 tablet (81 mg total) by mouth daily. Swallow whole.   Cholecalciferol (VITAMIN D3) 20 MCG (800 UNIT) TABS Take 1 tablet by mouth daily. Except for Saturday and Sunday   escitalopram (LEXAPRO) 10 MG tablet Take 1 tablet (10 mg total) by mouth daily.   gabapentin (NEURONTIN) 100 MG capsule Take 1-3 capsules (100-300 mg total) by mouth at bedtime as needed (pain).   levothyroxine (SYNTHROID) 25 MCG tablet TAKE 1 TABLET BY MOUTH DAILY  BEFORE BREAKFAST   lisinopril (ZESTRIL) 5 MG tablet Take 1 tablet (5 mg total) by mouth daily.   loperamide (IMODIUM) 2 MG capsule TAKE 1 CAPSULE BY MOUTH EVERY 6  HOURS AS NEEDED FOR DIARRHEA   Multiple Vitamins-Minerals (PRESERVISION AREDS PO) Take 1 capsule by mouth daily.    potassium chloride SA (KLOR-CON) 20 MEQ tablet Take 20 mEq by mouth daily.   furosemide (LASIX) 40 MG tablet Take 2 tablets (80 mg total) by mouth daily. (Patient not taking: Reported on 12/15/2021)   nitroGLYCERIN (NITROSTAT) 0.4 MG SL tablet DISSOLVE 1 TABLET UNDER THE  TONGUE EVERY 5 MINUTES AS  NEEDED FOR CHEST PAIN. MAX OF 3 TABLETS IN 15 MINUTES. CALL 911 IF PAIN  PERSISTS.   [DISCONTINUED] amoxicillin-clavulanate (AUGMENTIN) 875-125 MG tablet Take 1 tablet by mouth 2 (two) times daily.   No facility-administered encounter medications on file as of 12/15/2021.    Allergies (verified) Tape, Trazodone and nefazodone, and Mesalamine   History: Past Medical History:  Diagnosis Date   Angina pectoris (HCC) 11/22/2019   Arthritis    CAD (coronary artery  disease) 12/22/2019   Depression    Depression with anxiety 11/09/2019   DVT (deep venous thrombosis) (HCC)    Essential hypertension 11/22/2019   Generalized edema 11/09/2019   GERD (gastroesophageal reflux disease)    Heart disease    Hyperlipidemia    Hypertension    Insomnia    Lymphocytic colitis    Noninfectious gastroenteritis and colitis 01/12/2013   Pedal edema 11/22/2019   Spinal stenosis, lumbar    Thyroid disease    Weight gain 01/16/2014   Past Surgical History:  Procedure Laterality Date   CHOLECYSTECTOMY     LEFT HEART CATH AND CORONARY ANGIOGRAPHY N/A 11/23/2019   Procedure: LEFT HEART CATH AND CORONARY ANGIOGRAPHY;  Surgeon: Runell Gess, MD;  Location: [facility];  Service: Cardiovascular;  Laterality: N/A;   SPINE SURGERY     2 surgeries    Family History  Problem Relation Age of Onset   Heart attack Mother        56   Diabetes Mother    Other Father        black lung    Heart disease Father    Heart disease Sister        stents    Heart disease Brother    Other Brother        parkinson    COPD Daughter    Diabetes Daughter    Heart disease Daughter    Hypertension Daughter    Hyperlipidemia Daughter    Thyroid disease Daughter    COPD Son    Heart disease Son    Hypertension Son    Hyperlipidemia Son    Arthritis Son        rheum    AAA (abdominal aortic aneurysm) Brother    COPD Daughter    Heart disease Daughter    Hypertension Daughter    Thyroid disease Daughter    Arthritis Daughter        rheum    Heart disease Daughter    Heart attack Daughter    Hyperlipidemia Daughter    Thyroid disease Daughter    Social History   Socioeconomic History   Marital status: Widowed    Spouse name: Not on file   Number of children: 4   Years of education: Not on file   Highest education level: Not on file  Occupational History   Occupation: retired  Tobacco Use   Smoking status: Never   Smokeless tobacco: Never  Vaping Use    Vaping Use: Never used  Substance and Sexual Activity   Alcohol use: No   Drug use: No   Sexual activity: Not on file  Other Topics Concern   Not on file  Social History Narrative   Lives at home - in studio apartment behind daughters house   Has macular degeneration, so she cannot drive - daughter helps her out a lot, but she tries to be independent as much as she can   Social Determinants of Corporate investment banker  Strain: Low Risk  (12/15/2021)   Overall Financial Resource Strain (CARDIA)    Difficulty of Paying Living Expenses: Not hard at all  Food Insecurity: No Food Insecurity (12/15/2021)   Hunger Vital Sign    Worried About Running Out of Food in the Last Year: Never true    Ran Out of Food in the Last Year: Never true  Transportation Needs: No Transportation Needs (12/15/2021)   PRAPARE - Administrator, Civil ServiceTransportation    Lack of Transportation (Medical): No    Lack of Transportation (Non-Medical): No  Physical Activity: Inactive (12/15/2021)   Exercise Vital Sign    Days of Exercise per Week: 0 days    Minutes of Exercise per Session: 0 min  Stress: No Stress Concern Present (12/15/2021)   [employer] - Occupational Stress Questionnaire    Feeling of Stress : Not at all  Social Connections: Moderately Isolated (12/15/2021)   Social Connection and Isolation Panel [NHANES]    Frequency of Communication with Friends and Family: More than three times a week    Frequency of Social Gatherings with Friends and Family: More than three times a week    Attends Religious Services: Never    Database administratorActive Member of Clubs or Organizations: Yes    Attends Engineer, structuralClub or Organization Meetings: More than 4 times per year    Marital Status: Widowed    Tobacco Counseling Counseling given: Not Answered   Clinical Intake:  Pre-visit preparation completed: Yes  Pain : 0-10 Pain Score: 6  Pain Type: Chronic pain Pain Location: Generalized Pain Descriptors / Indicators: Aching,  Discomfort, Tender Pain Onset: More than a month ago Pain Frequency: Intermittent     BMI - recorded: 32.24 Nutritional Status: BMI > 30  Obese Nutritional Risks: None Diabetes: No  How often do you need to have someone help you when you read instructions, pamphlets, or other written materials from your doctor or pharmacy?: 1 - Never  Diabetic? no  Interpreter Needed?: No  Information entered by :: Fionnuala Hemmerich, LPN   Activities of Daily Living    12/15/2021    1:24 PM  In your present state of health, do you have any difficulty performing the following activities:  Hearing? 1  Vision? 1  Difficulty concentrating or making decisions? 1  Walking or climbing stairs? 0  Dressing or bathing? 0  Doing errands, shopping? 0  Preparing Food and eating ? N  Using the Toilet? N  In the past six months, have you accidently leaked urine? Y  Do you have problems with loss of bowel control? Y  Comment colitis and recently had C.Diff and hasn't gotten completely better  Managing your Medications? N  Managing your Finances? N  Housekeeping or managing your Housekeeping? N    Patient Care Team: Raliegh IpGottschalk, Ashly M, DO as PCP - General (Family Medicine)  Indicate any recent Medical Services you may have received from other than [facility] providers in the past year (date may be approximate).     Assessment:   This is a routine wellness examination for [place].  Hearing/Vision screen Hearing Screening - Comments:: [place] [facility] [place] Vision Screening - Comments:: Wears rx glasses - up to date with routine eye exams with [facility] - almost completely blind  Dietary issues and exercise activities discussed: Current Exercise Habits: The patient does not participate in regular exercise at present, Exercise limited by: orthopedic condition(s);neurologic condition(s);cardiac condition(s)   Goals Addressed  This Visit's Progress    Prevent falls        Try to exercise legs and do chair exercises for strength and balance       Depression Screen    12/15/2021    1:23 PM 11/04/2021    3:19 PM 12/12/2020    3:01 PM 12/11/2020    3:14 PM 04/23/2020    2:53 PM 12/12/2019    4:03 PM 11/09/2019    4:09 PM  PHQ 2/9 Scores  PHQ - 2 Score 4 5 2 4  0 2 2  PHQ- 9 Score 14 15 8 10  0 8 10    Fall Risk    12/15/2021    1:21 PM 11/04/2021    3:18 PM 12/12/2020    3:03 PM 12/11/2020    3:14 PM 04/23/2020    2:52 PM  Fall Risk   Falls in the past year? 1 1 0 0 0  Number falls in past yr: 0 0 0    Injury with Fall? 1 1 0    Comment  bruising on face     Risk for fall due to : History of fall(s);Impaired balance/gait;Orthopedic patient Impaired balance/gait Orthopedic patient;Impaired vision    Follow up Education provided;Falls prevention discussed Falls evaluation completed Education provided;Falls prevention discussed      FALL RISK PREVENTION PERTAINING TO THE HOME:  Any stairs in or around the home? No  If so, are there any without handrails? No  Home free of loose throw rugs in walkways, pet beds, electrical cords, etc? Yes  Adequate lighting in your home to reduce risk of falls? Yes   ASSISTIVE DEVICES UTILIZED TO PREVENT FALLS:  Life alert? No  Use of a cane, walker or w/c? Yes  Grab bars in the bathroom? Yes  Shower chair or bench in shower? Yes  Elevated toilet seat or a handicapped toilet? Yes   TIMED UP AND GO:  Was the test performed? No . Telephonic visit  Cognitive Function:        12/15/2021    1:26 PM 12/12/2020    2:51 PM  6CIT Screen  What Year? 0 points 0 points  What month? 0 points 0 points  What time? 0 points 0 points  Count back from 20 0 points 0 points  Months in reverse 0 points 0 points  Repeat phrase 2 points 0 points  Total Score 2 points 0 points    Immunizations Immunization History  Administered Date(s) Administered   Influenza,inj,Quad PF,6+ Mos 04/14/2013, 03/15/2014    Influenza-Unspecified 03/25/2020, 03/03/2021   Moderna Sars-Covid-2 Vaccination 06/23/2019, 07/21/2019, 03/25/2020, 10/30/2020   PNEUMOCOCCAL CONJUGATE-20 06/13/2021   Pneumococcal Conjugate-13 03/15/2014   Zoster Recombinat (Shingrix) 06/23/2018, 08/22/2018    TDAP status: Up to date  Flu Vaccine status: Up to date  Pneumococcal vaccine status: Up to date  Covid-19 vaccine status: Completed vaccines  Qualifies for Shingles Vaccine? Yes   Zostavax completed Yes   Shingrix Completed?: Yes  Screening Tests Health Maintenance  Topic Date Due   TETANUS/TDAP  Never done   DEXA SCAN  Never done   COVID-19 Vaccine (5 - Moderna series) 12/25/2020   INFLUENZA VACCINE  12/23/2021   Pneumonia Vaccine 52+ Years old  Completed   Zoster Vaccines- Shingrix  Completed   HPV VACCINES  Aged Out    Health Maintenance  Health Maintenance Due  Topic Date Due   TETANUS/TDAP  Never done   DEXA SCAN  Never done   COVID-19 Vaccine (5 -  Moderna series) 12/25/2020    Colorectal cancer screening: No longer required.   Mammogram status: No longer required due to age.  DECLINED DEXA  Lung Cancer Screening: (Low Dose CT Chest recommended if Age 36-80 years, 30 pack-year currently smoking OR have quit w/in 15years.) does not qualify  Additional Screening:  Hepatitis C Screening: does not qualify.  Vision Screening: Recommended annual ophthalmology exams for early detection of glaucoma and other disorders of the eye. Is the patient up to date with their annual eye exam?  Yes  Who is the provider or what is the name of the office in which the patient attends annual eye exams? [facility] If pt is not established with a provider, would they like to be referred to a provider to establish care? No .   Dental Screening: Recommended annual dental exams for proper oral hygiene  Community Resource Referral / Chronic Care Management: CRR required this visit?  No   CCM required this visit?  No       Plan:     I have personally reviewed and noted the following in the patient's chart:   Medical and social history Use of alcohol, tobacco or illicit drugs  Current medications and supplements including opioid prescriptions.  Functional ability and status Nutritional status Physical activity Advanced directives List of other physicians Hospitalizations, surgeries, and ER visits in previous 12 months Vitals Screenings to include cognitive, depression, and falls Referrals and appointments  In addition, I have reviewed and discussed with patient certain preventive protocols, quality metrics, and best practice recommendations. A written personalized care plan for preventive services as well as general preventive health recommendations were provided to patient.     [place] Constable, LPN   [id number]/[id number]   Nurse Notes: none

## 2022-01-09 ENCOUNTER — Other Ambulatory Visit: Payer: Self-pay | Admitting: Family Medicine

## 2022-04-01 ENCOUNTER — Other Ambulatory Visit: Payer: Self-pay | Admitting: Family Medicine

## 2022-04-01 DIAGNOSIS — F418 Other specified anxiety disorders: Secondary | ICD-10-CM

## 2022-04-02 ENCOUNTER — Other Ambulatory Visit: Payer: Self-pay | Admitting: Family Medicine

## 2022-04-02 ENCOUNTER — Encounter: Payer: Self-pay | Admitting: Family Medicine

## 2022-04-02 NOTE — Telephone Encounter (Signed)
Gottschalk NTBS last chronic FU last Jan. Refill not sent to pharmacy

## 2022-04-02 NOTE — Telephone Encounter (Signed)
Letter sent.

## 2022-04-13 ENCOUNTER — Ambulatory Visit: Payer: Medicare Other | Admitting: Family Medicine

## 2022-04-21 ENCOUNTER — Encounter: Payer: Self-pay | Admitting: Family Medicine

## 2022-04-21 ENCOUNTER — Ambulatory Visit (INDEPENDENT_AMBULATORY_CARE_PROVIDER_SITE_OTHER): Payer: Medicare Other | Admitting: Family Medicine

## 2022-04-21 VITALS — BP 138/67 | HR 62 | Temp 97.7°F | Ht 63.0 in | Wt 183.4 lb

## 2022-04-21 DIAGNOSIS — Z23 Encounter for immunization: Secondary | ICD-10-CM

## 2022-04-21 DIAGNOSIS — R443 Hallucinations, unspecified: Secondary | ICD-10-CM | POA: Diagnosis not present

## 2022-04-21 DIAGNOSIS — E039 Hypothyroidism, unspecified: Secondary | ICD-10-CM | POA: Diagnosis not present

## 2022-04-21 DIAGNOSIS — M159 Polyosteoarthritis, unspecified: Secondary | ICD-10-CM | POA: Diagnosis not present

## 2022-04-21 DIAGNOSIS — E538 Deficiency of other specified B group vitamins: Secondary | ICD-10-CM

## 2022-04-21 DIAGNOSIS — I10 Essential (primary) hypertension: Secondary | ICD-10-CM

## 2022-04-21 DIAGNOSIS — D692 Other nonthrombocytopenic purpura: Secondary | ICD-10-CM

## 2022-04-21 DIAGNOSIS — F418 Other specified anxiety disorders: Secondary | ICD-10-CM

## 2022-04-21 LAB — URINALYSIS, ROUTINE W REFLEX MICROSCOPIC
Bilirubin, UA: NEGATIVE
Glucose, UA: NEGATIVE
Ketones, UA: NEGATIVE
Leukocytes,UA: NEGATIVE
Nitrite, UA: NEGATIVE
Protein,UA: NEGATIVE
RBC, UA: NEGATIVE
Specific Gravity, UA: 1.02 (ref 1.005–1.030)
Urobilinogen, Ur: 0.2 mg/dL (ref 0.2–1.0)
pH, UA: 5.5 (ref 5.0–7.5)

## 2022-04-21 MED ORDER — LISINOPRIL 5 MG PO TABS: 5.0000 mg | ORAL_TABLET | Freq: Every day | ORAL | 3 refills | Status: DC

## 2022-04-21 MED ORDER — LEVOTHYROXINE SODIUM 25 MCG PO TABS: 25.0000 ug | ORAL_TABLET | Freq: Every day | ORAL | 3 refills | Status: DC

## 2022-04-21 MED ORDER — ESCITALOPRAM OXALATE 10 MG PO TABS: 10.0000 mg | ORAL_TABLET | Freq: Every day | ORAL | 3 refills | Status: DC

## 2022-04-21 MED ORDER — GABAPENTIN 100 MG PO CAPS: 100.0000 mg | ORAL_CAPSULE | Freq: Every evening | ORAL | 3 refills | Status: DC | PRN

## 2022-04-21 MED ORDER — FUROSEMIDE 40 MG PO TABS: 80.0000 mg | ORAL_TABLET | Freq: Every day | ORAL | 3 refills | Status: DC

## 2022-04-21 NOTE — Patient Instructions (Signed)
Pending your kidney function (which has been impaired last 3 checks), we can discuss adding NSAID for arthritis if it is normal. Otherwise, you may have to strongly consider using the Voltaren gel more regularly ALONG with your Tylenol and seeing [place] back for joint injections if needed.  Pain clinic is also an option if you feel like you'd want to be on pain killers.  I can refer if needed  I'm going to check some labs and likely MRI brain given your hallucinations.

## 2022-04-21 NOTE — Progress Notes (Signed)
Subjective: HG:DJMEQ up/ med fills PCP: Janora Norlander, DO AST:[id number] Kelsey Mills is a 86 y.o. female who is accompanied today's visit by her daughter.  She is presenting to clinic today for:  1.  Hypothyroidism Patient is compliant with her medications.  Does not report any heart palpitations, changes in weight.  She reports good sleep.  She reports that mental health is under good control with current regimen.  However, after further discussion she reports that she has been having both visual and auditory hallucinations.  Denies any dysuria, materia, new or OTC medications.  No use of controlled substances.  No recent changes in doses of medications.  There is no family history of schizophrenia but there is a family history of Parkinson's disease.  Patient denies any speech issues, changes in balance, sensory or motor changes.  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   ROS: Per HPI  Allergies  Allergen Reactions   Tape Other (See Comments)    SKIN IS VERY THIN AND TEARS/BLEEDS VERY EASILY!!!!   Trazodone And Nefazodone Nausea And Vomiting   Mesalamine Rash   Past Medical History:  Diagnosis Date   Angina pectoris ([place]) 11/22/2019   Arthritis    CAD (coronary artery disease) 12/22/2019   Depression    Depression with anxiety 11/09/2019   DVT (deep venous thrombosis) (HCC)    Essential hypertension 11/22/2019   Generalized edema 11/09/2019   GERD (gastroesophageal reflux disease)    Heart disease    Hyperlipidemia    Hypertension    Insomnia    Lymphocytic colitis    Noninfectious gastroenteritis and colitis 01/12/2013   Pedal edema 11/22/2019   Spinal stenosis, lumbar    Thyroid disease    Weight gain 01/16/2014    Current Outpatient Medications:    acetaminophen (TYLENOL) 650 MG CR tablet, Take 1,300 mg by mouth in the morning., Disp: , Rfl:    aspirin EC 81  MG tablet, Take 1 tablet (81 mg total) by mouth daily. Swallow whole., Disp: 90 tablet, Rfl: 3   Cholecalciferol (VITAMIN D3) 20 MCG (800 UNIT) TABS, Take 1 tablet by mouth daily. Except for Saturday and Sunday, Disp: , Rfl:    loperamide (IMODIUM) 2 MG capsule, TAKE 1 CAPSULE BY MOUTH EVERY 6  HOURS AS NEEDED FOR DIARRHEA, Disp: 60 capsule, Rfl: 0   Multiple Vitamins-Minerals (PRESERVISION AREDS PO), Take 1 capsule by mouth daily. , Disp: , Rfl:    nitroGLYCERIN (NITROSTAT) 0.4 MG SL tablet, DISSOLVE 1 TABLET UNDER THE  TONGUE EVERY 5 MINUTES AS NEEDED FOR CHEST PAIN. MAX OF 3 TABLETS IN 15 MINUTES. CALL 911 IF PAIN  PERSISTS., Disp: 100 tablet, Rfl: 3   potassium chloride SA (KLOR-CON) 20 MEQ tablet, Take 20 mEq by mouth daily., Disp: , Rfl:    escitalopram (LEXAPRO) 10 MG tablet, Take 1 tablet (10 mg total) by mouth daily., Disp: 90 tablet, Rfl: 3   furosemide (LASIX) 40 MG tablet, Take 2 tablets (80 mg total) by mouth daily., Disp: 180 tablet, Rfl: 3   gabapentin (NEURONTIN) 100 MG capsule, Take 1-3 capsules (100-300 mg total) by mouth at bedtime as needed (pain)., Disp: 270 capsule, Rfl: 3   levothyroxine (SYNTHROID) 25 MCG tablet, Take 1 tablet (25 mcg total) by mouth daily before breakfast., Disp: 90 tablet, Rfl: 3   lisinopril (ZESTRIL) 5 MG tablet, Take 1 tablet (5 mg total)  by mouth daily., Disp: 90 tablet, Rfl: 3 Social History   Socioeconomic History   Marital status: Widowed    Spouse name: Not on file   Number of children: 4   Years of education: Not on file   Highest education level: Not on file  Occupational History   Occupation: retired  Tobacco Use   Smoking status: Never   Smokeless tobacco: Never  Vaping Use   Vaping Use: Never used  Substance and Sexual Activity   Alcohol use: No   Drug use: No   Sexual activity: Not on file  Other Topics Concern   Not on file  Social History Narrative   Lives at home - in studio apartment behind daughters house   Has macular  degeneration, so she cannot drive - daughter helps her out a lot, but she tries to be independent as much as she can   Social Determinants of Radio broadcast assistant Strain: Low Risk  (12/15/2021)   Overall Financial Resource Strain (CARDIA)    Difficulty of Paying Living Expenses: Not hard at all  Food Insecurity: No Food Insecurity (12/15/2021)   Hunger Vital Sign    Worried About Running Out of Food in the Last Year: Never true    [place] in the Last Year: Never true  Transportation Needs: No Transportation Needs (12/15/2021)   PRAPARE - Hydrologist (Medical): No    Lack of Transportation (Non-Medical): No  Physical Activity: Inactive (12/15/2021)   Exercise Vital Sign    Days of Exercise per Week: 0 days    Minutes of Exercise per Session: 0 min  Stress: No Stress Concern Present (12/15/2021)   [place]    Feeling of Stress : Not at all  Social Connections: Moderately Isolated (12/15/2021)   Social Connection and Isolation Panel [NHANES]    Frequency of Communication with Friends and Family: More than three times a week    Frequency of Social Gatherings with Friends and Family: More than three times a week    Attends Religious Services: Never    Marine scientist or Organizations: Yes    Attends Music therapist: More than 4 times per year    Marital Status: Widowed  Intimate Partner Violence: Not At Risk (12/15/2021)   Humiliation, Afraid, Rape, and Kick questionnaire    Fear of Current or Ex-Partner: No    Emotionally Abused: No    Physically Abused: No    Sexually Abused: No   Family History  Problem Relation Age of Onset   Heart attack Mother        85   Diabetes Mother    Other Father        black lung    Heart disease Father    Heart disease Sister        stents    Heart disease Brother    Other Brother        parkinson    COPD Daughter     Diabetes Daughter    Heart disease Daughter    Hypertension Daughter    Hyperlipidemia Daughter    Thyroid disease Daughter    COPD Son    Heart disease Son    Hypertension Son    Hyperlipidemia Son    Arthritis Son        rheum    AAA (abdominal aortic aneurysm) Brother    COPD Daughter  Heart disease Daughter    Hypertension Daughter    Thyroid disease Daughter    Arthritis Daughter        rheum    Heart disease Daughter    Heart attack Daughter    Hyperlipidemia Daughter    Thyroid disease Daughter     Objective: Office vital signs reviewed. BP 138/67   Pulse 62   Temp 97.7 F (36.5 C)   Ht _0  (1.6 m)   Wt 183 lb 6.4 oz (83.2 kg)   SpO2 94%   BMI 32.49 kg/m   Physical Examination:  General: Awake, alert, nontoxic-appearing female, No acute distress HEENT: PERRLA, EOMI, sclera white. Cardio: regular rate and rhythm, S1S2 heard, no murmurs appreciated Pulm: clear to auscultation bilaterally, no wheezes, rhonchi or rales; normal work of breathing on room air MSK: Antalgic gait and station.  Ambulates with assistance Skin: dry; intact; no rashes or lesions Neuro: No tremor.  Cranial nerves II through XII grossly intact.  Follows commands. Psych: Does not appear to be responding to internal stimuli     04/21/2022   10:27 AM 12/15/2021    1:23 PM 11/04/2021    3:19 PM  Depression screen PHQ 2/9  Decreased Interest _1 Down, Depressed, Hopeless _2 PHQ - 2 Score _3 Altered sleeping _4 Tired, decreased energy _5 Change in appetite 0 2 2  Feeling bad or failure about yourself  0 0 0  Trouble concentrating 0 2 2  Moving slowly or fidgety/restless 0 0 0  Suicidal thoughts _6 PHQ-9 Score _7 Difficult doing work/chores Not difficult at all Very difficult Very difficult      04/21/2022   10:27 AM 11/04/2021    3:19 PM 12/11/2020    3:14 PM 11/09/2019    4:11 PM  GAD 7 : Generalized Anxiety Score  Nervous, Anxious, on Edge 0  1 0 2  Control/stop worrying 0 0 2 3  Worry too much - different things 0 0 2 3  Trouble relaxing 0 0 0 1  Restless 0 0 0 0  Easily annoyed or irritable 0 0 0 0  Afraid - awful might happen 0 0 0 0  Total GAD 7 Score 0 _8 Anxiety Difficulty Not difficult at all Not difficult at all Not difficult at all Not difficult at all      Assessment/ Plan: 86 y.o. female   Hallucinations - Plan: Ammonia, Urinalysis, Routine w reflex microscopic, Urine Culture  Primary osteoarthritis involving multiple joints  Acquired hypothyroidism - Plan: TSH, T4, Free, levothyroxine (SYNTHROID) 25 MCG tablet  Vitamin B12 deficiency - Plan: Vitamin B12  Essential hypertension - Plan: CMP14+EGFR, furosemide (LASIX) 40 MG tablet, lisinopril (ZESTRIL) 5 MG tablet  Purpura senilis (HCC) - Plan: CBC  Depression with anxiety - Plan: escitalopram (LEXAPRO) 10 MG tablet  Need for immunization against influenza - Plan: Flu Vaccine QUAD High Dose(Fluad)  Quite concerned given reports of both visual and auditory hallucinations.  She has a history of macular degeneration but I do not think that auditory hallucinations would be explained by that.  For this reason I am looking for metabolic reasons and I have ordered several labs in efforts to further investigate.  If these are normal, plan to proceed with MRI of the brain and possible referral.  Could consider evaluation for dementia/Parkinson's dementia though she was able to follow  commands and did not demonstrate any focal neurologic deficits on exam.  She is treated for depressive disorder but denies any increased depression or other mental health concerns.  However, her PHQ score is not consistent with her reports of good rest and stable depression today.  We discussed that her arthritis is very limited by her renal function so we could consider more consistent use of topical NSAIDs versus joint injections versus referral for pain management.  Orders Placed  This Encounter  Procedures   Urine Culture   Flu Vaccine QUAD High Dose(Fluad)   Vitamin B12   TSH   T4, Free   CMP14+EGFR   CBC   Ammonia   Urinalysis, Routine w reflex microscopic   Meds ordered this encounter  Medications   escitalopram (LEXAPRO) 10 MG tablet    Sig: Take 1 tablet (10 mg total) by mouth daily.    Dispense:  90 tablet    Refill:  3   furosemide (LASIX) 40 MG tablet    Sig: Take 2 tablets (80 mg total) by mouth daily.    Dispense:  180 tablet    Refill:  3    Please send a replace/new response with 90-Day Supply if appropriate to maximize member benefit. Requesting 1 year supply.   gabapentin (NEURONTIN) 100 MG capsule    Sig: Take 1-3 capsules (100-300 mg total) by mouth at bedtime as needed (pain).    Dispense:  270 capsule    Refill:  3   levothyroxine (SYNTHROID) 25 MCG tablet    Sig: Take 1 tablet (25 mcg total) by mouth daily before breakfast.    Dispense:  90 tablet    Refill:  3    Please send a replace/new response with 90-Day Supply if appropriate to maximize member benefit. Requesting 1 year supply.   lisinopril (ZESTRIL) 5 MG tablet    Sig: Take 1 tablet (5 mg total) by mouth daily.    Dispense:  90 tablet    Refill:  [place], [place] [phone]

## 2022-04-22 ENCOUNTER — Other Ambulatory Visit: Payer: Self-pay | Admitting: Family Medicine

## 2022-04-22 DIAGNOSIS — R443 Hallucinations, unspecified: Secondary | ICD-10-CM

## 2022-04-22 DIAGNOSIS — F418 Other specified anxiety disorders: Secondary | ICD-10-CM

## 2022-04-22 LAB — CMP14+EGFR
ALT: 9 IU/L (ref 0–32)
AST: 11 IU/L (ref 0–40)
Albumin/Globulin Ratio: 1.5 (ref 1.2–2.2)
Albumin: 4 g/dL (ref 3.7–4.7)
Alkaline Phosphatase: 90 IU/L (ref 44–121)
BUN/Creatinine Ratio: 18 (ref 12–28)
BUN: 18 mg/dL (ref 8–27)
Bilirubin Total: 0.5 mg/dL (ref 0.0–1.2)
CO2: 25 mmol/L (ref 20–29)
Calcium: 9 mg/dL (ref 8.7–10.3)
Chloride: 107 mmol/L — ABNORMAL HIGH (ref 96–106)
Creatinine, Ser: 0.98 mg/dL (ref 0.57–1.00)
Globulin, Total: 2.6 g/dL (ref 1.5–4.5)
Glucose: 88 mg/dL (ref 70–99)
Potassium: 4.6 mmol/L (ref 3.5–5.2)
Sodium: 147 mmol/L — ABNORMAL HIGH (ref 134–144)
Total Protein: 6.6 g/dL (ref 6.0–8.5)
eGFR: 57 mL/min/{1.73_m2} — ABNORMAL LOW (ref >59)

## 2022-04-22 LAB — CBC
Hematocrit: 40.4 % (ref 34.0–46.6)
Hemoglobin: 13.4 g/dL (ref 11.1–15.9)
MCH: 32 pg (ref 26.6–33.0)
MCHC: 33.2 g/dL (ref 31.5–35.7)
MCV: 96 fL (ref 79–97)
Platelets: 210 10*3/uL (ref 150–450)
RBC: 4.19 x10E6/uL (ref 3.77–5.28)
RDW: 13.3 % (ref 11.7–15.4)
WBC: 6.9 10*3/uL (ref 3.4–10.8)

## 2022-04-22 LAB — TSH: TSH: 5.11 u[IU]/mL — ABNORMAL HIGH (ref 0.450–4.500)

## 2022-04-22 LAB — VITAMIN B12: Vitamin B-12: 675 pg/mL (ref 232–1245)

## 2022-04-22 LAB — AMMONIA: Ammonia: 29 ug/dL (ref 28–135)

## 2022-04-22 LAB — T4, FREE: Free T4: 1.42 ng/dL (ref 0.82–1.77)

## 2022-04-23 LAB — URINE CULTURE

## 2022-05-14 ENCOUNTER — Ambulatory Visit (HOSPITAL_COMMUNITY): Admission: RE | Admit: 2022-05-14 | Payer: Medicare Other | Source: Ambulatory Visit

## 2022-06-25 ENCOUNTER — Other Ambulatory Visit: Payer: Self-pay | Admitting: Family Medicine

## 2022-08-27 ENCOUNTER — Other Ambulatory Visit: Payer: Self-pay | Admitting: Family Medicine

## 2022-10-29 ENCOUNTER — Other Ambulatory Visit: Payer: Self-pay | Admitting: Family Medicine

## 2022-12-17 ENCOUNTER — Ambulatory Visit: Payer: Medicare Other

## 2022-12-17 VITALS — Ht 62.0 in | Wt 175.0 lb

## 2022-12-17 DIAGNOSIS — Z0000 Encounter for general adult medical examination without abnormal findings: Secondary | ICD-10-CM

## 2022-12-17 NOTE — Patient Instructions (Signed)
Kelsey Mills , Thank you for taking time to come for your Medicare Wellness Visit. I appreciate your ongoing commitment to your health goals. Please review the following plan we discussed and let me know if I can assist you in the future.   Referrals/Orders/Follow-Ups/Clinician Recommendations: Aim for 30 minutes of exercise or brisk walking, 6-8 glasses of water, and 5 servings of fruits and vegetables each day.   This is a list of the screening recommended for you and due dates:  Health Maintenance  Topic Date Due   DTaP/Tdap/Td vaccine (1 - Tdap) Never done   COVID-19 Vaccine (5 - 2023-24 season) 01/23/2022   Flu Shot  12/24/2022   Medicare Annual Wellness Visit  12/17/2023   Pneumonia Vaccine  Completed   Zoster (Shingles) Vaccine  Completed   HPV Vaccine  Aged Out   DEXA scan (bone density measurement)  Discontinued    Advanced directives: (Provided) Advance directive discussed with you today. I have provided a copy for you to complete at home and have notarized. Once this is complete, please bring a copy in to our office so we can scan it into your chart.   Next Medicare Annual Wellness Visit scheduled for next year: Yes  Preventive Care 17 Years and Older, Female Preventive care refers to lifestyle choices and visits with your health care provider that can promote health and wellness. What does preventive care include? A yearly physical exam. This is also called an annual well check. Dental exams once or twice a year. Routine eye exams. Ask your health care provider how often you should have your eyes checked. Personal lifestyle choices, including: Daily care of your teeth and gums. Regular physical activity. Eating a healthy diet. Avoiding tobacco and drug use. Limiting alcohol use. Practicing safe sex. Taking low-dose aspirin every day. Taking vitamin and mineral supplements as recommended by your health care provider. What happens during an annual well check? The  services and screenings done by your health care provider during your annual well check will depend on your age, overall health, lifestyle risk factors, and family history of disease. Counseling  Your health care provider may ask you questions about your: Alcohol use. Tobacco use. Drug use. Emotional well-being. Home and relationship well-being. Sexual activity. Eating habits. History of falls. Memory and ability to understand (cognition). Work and work Astronomer. Reproductive health. Screening  You may have the following tests or measurements: Height, weight, and BMI. Blood pressure. Lipid and cholesterol levels. These may be checked every 5 years, or more frequently if you are over 25 years old. Skin check. Lung cancer screening. You may have this screening every year starting at age 65 if you have a 30-pack-year history of smoking and currently smoke or have quit within the past 15 years. Fecal occult blood test (FOBT) of the stool. You may have this test every year starting at age 11. Flexible sigmoidoscopy or colonoscopy. You may have a sigmoidoscopy every 5 years or a colonoscopy every 10 years starting at age 73. Hepatitis C blood test. Hepatitis B blood test. Sexually transmitted disease (STD) testing. Diabetes screening. This is done by checking your blood sugar (glucose) after you have not eaten for a while (fasting). You may have this done every 1-3 years. Bone density scan. This is done to screen for osteoporosis. You may have this done starting at age 40. Mammogram. This may be done every 1-2 years. Talk to your health care provider about how often you should have regular mammograms. Talk  with your health care provider about your test results, treatment options, and if necessary, the need for more tests. Vaccines  Your health care provider may recommend certain vaccines, such as: Influenza vaccine. This is recommended every year. Tetanus, diphtheria, and acellular  pertussis (Tdap, Td) vaccine. You may need a Td booster every 10 years. Zoster vaccine. You may need this after age 18. Pneumococcal 13-valent conjugate (PCV13) vaccine. One dose is recommended after age 51. Pneumococcal polysaccharide (PPSV23) vaccine. One dose is recommended after age 41. Talk to your health care provider about which screenings and vaccines you need and how often you need them. This information is not intended to replace advice given to you by your health care provider. Make sure you discuss any questions you have with your health care provider. Document Released: 06/07/2015 Document Revised: 01/29/2016 Document Reviewed: 03/12/2015 Elsevier Interactive Patient Education  2017 [employer].  Fall Prevention in the Home Falls can cause injuries. They can happen to people of all ages. There are many things you can do to make your home safe and to help prevent falls. What can I do on the outside of my home? Regularly fix the edges of walkways and driveways and fix any cracks. Remove anything that might make you trip as you walk through a door, such as a raised step or threshold. Trim any bushes or trees on the path to your home. Use bright outdoor lighting. Clear any walking paths of anything that might make someone trip, such as rocks or tools. Regularly check to see if handrails are loose or broken. Make sure that both sides of any steps have handrails. Any raised decks and porches should have guardrails on the edges. Have any leaves, snow, or ice cleared regularly. Use sand or salt on walking paths during winter. Clean up any spills in your garage right away. This includes oil or grease spills. What can I do in the bathroom? Use night lights. Install grab bars by the toilet and in the tub and shower. Do not use towel bars as grab bars. Use non-skid mats or decals in the tub or shower. If you need to sit down in the shower, use a plastic, non-slip stool. Keep the floor  dry. Clean up any water that spills on the floor as soon as it happens. Remove soap buildup in the tub or shower regularly. Attach bath mats securely with double-sided non-slip rug tape. Do not have throw rugs and other things on the floor that can make you trip. What can I do in the bedroom? Use night lights. Make sure that you have a light by your bed that is easy to reach. Do not use any sheets or blankets that are too big for your bed. They should not hang down onto the floor. Have a firm chair that has side arms. You can use this for support while you get dressed. Do not have throw rugs and other things on the floor that can make you trip. What can I do in the kitchen? Clean up any spills right away. Avoid walking on wet floors. Keep items that you use a lot in easy-to-reach places. If you need to reach something above you, use a strong step stool that has a grab bar. Keep electrical cords out of the way. Do not use floor polish or wax that makes floors slippery. If you must use wax, use non-skid floor wax. Do not have throw rugs and other things on the floor that can make you  trip. What can I do with my stairs? Do not leave any items on the stairs. Make sure that there are handrails on both sides of the stairs and use them. Fix handrails that are broken or loose. Make sure that handrails are as long as the stairways. Check any carpeting to make sure that it is firmly attached to the stairs. Fix any carpet that is loose or worn. Avoid having throw rugs at the top or bottom of the stairs. If you do have throw rugs, attach them to the floor with carpet tape. Make sure that you have a light switch at the top of the stairs and the bottom of the stairs. If you do not have them, ask someone to add them for you. What else can I do to help prevent falls? Wear shoes that: Do not have high heels. Have rubber bottoms. Are comfortable and fit you well. Are closed at the toe. Do not wear  sandals. If you use a stepladder: Make sure that it is fully opened. Do not climb a closed stepladder. Make sure that both sides of the stepladder are locked into place. Ask someone to hold it for you, if possible. Clearly mark and make sure that you can see: Any grab bars or handrails. First and last steps. Where the edge of each step is. Use tools that help you move around (mobility aids) if they are needed. These include: Canes. Walkers. Scooters. Crutches. Turn on the lights when you go into a dark area. Replace any light bulbs as soon as they burn out. Set up your furniture so you have a clear path. Avoid moving your furniture around. If any of your floors are uneven, fix them. If there are any pets around you, be aware of where they are. Review your medicines with your doctor. Some medicines can make you feel dizzy. This can increase your chance of falling. Ask your doctor what other things that you can do to help prevent falls. This information is not intended to replace advice given to you by your health care provider. Make sure you discuss any questions you have with your health care provider. Document Released: 03/07/2009 Document Revised: 10/17/2015 Document Reviewed: 06/15/2014 Elsevier Interactive Patient Education  2017 [employer].

## 2022-12-17 NOTE — Progress Notes (Signed)
Subjective:   Kelsey Mills is a 87 y.o. female who presents for Medicare Annual (Subsequent) preventive examination.  Visit Complete: Virtual  I connected with  Kelsey Mills on 12/17/22 by a audio enabled telemedicine application and verified that I am speaking with the correct person using two identifiers.  Patient Location: Home  Provider Location: Home Office  I discussed the limitations of evaluation and management by telemedicine. The patient expressed understanding and agreed to proceed.  Patient Medicare AWV questionnaire was completed by the patient on 12/17/2022; I have confirmed that all information answered by patient is correct and no changes since this date.  Review of Systems    Vital Signs: Unable to obtain new vitals due to this being a telehealth visit.  Cardiac Risk Factors include: advanced age (>51men, >86 women);sedentary lifestyle     Objective:    Today's Vitals   12/17/22 1317  Weight: 175 lb (79.4 kg)  Height: 5\' 2"  (1.575 m)   Body mass index is 32.01 kg/m.     12/17/2022    1:21 PM 12/15/2021    1:44 PM 12/12/2020    2:50 PM 12/19/2019    7:39 PM 11/30/2019    3:21 PM 11/23/2019    7:20 AM  Advanced Directives  Does Patient Have a Medical Advance Directive? Yes Yes Yes No No Yes  Type of Estate agent of [place];Living will Healthcare Power of [place];Living will Healthcare Power of [place];Living will Living will;Healthcare Power of Attorney Living will;Healthcare Power of Attorney Living will;Healthcare Power of Attorney  Does patient want to make changes to medical advance directive?    No - Patient declined No - Patient declined No - Patient declined  Copy of Healthcare Power of Attorney in Chart? No - copy requested No - copy requested No - copy requested   No - copy requested  Would patient like information on creating a medical advance directive?    No - Guardian declined No - Patient declined     Current  Medications (verified) Outpatient Encounter Medications as of 12/17/2022  Medication Sig   acetaminophen (TYLENOL) 650 MG CR tablet Take 1,300 mg by mouth in the morning.   aspirin EC 81 MG tablet Take 1 tablet (81 mg total) by mouth daily. Swallow whole.   Cholecalciferol (VITAMIN D3) 20 MCG (800 UNIT) TABS Take 1 tablet by mouth daily. Except for Saturday and Sunday   escitalopram (LEXAPRO) 10 MG tablet Take 1 tablet (10 mg total) by mouth daily.   furosemide (LASIX) 40 MG tablet Take 2 tablets (80 mg total) by mouth daily.   gabapentin (NEURONTIN) 100 MG capsule Take 1-3 capsules (100-300 mg total) by mouth at bedtime as needed (pain).   levothyroxine (SYNTHROID) 25 MCG tablet Take 1 tablet (25 mcg total) by mouth daily before breakfast.   lisinopril (ZESTRIL) 5 MG tablet Take 1 tablet (5 mg total) by mouth daily.   loperamide (IMODIUM) 2 MG capsule TAKE 1 CAPSULE BY MOUTH EVERY 6  HOURS AS NEEDED FOR DIARRHEA   Multiple Vitamins-Minerals (PRESERVISION AREDS PO) Take 1 capsule by mouth daily.    nitroGLYCERIN (NITROSTAT) 0.4 MG SL tablet DISSOLVE 1 TABLET UNDER THE  TONGUE EVERY 5 MINUTES AS NEEDED FOR CHEST PAIN. MAX OF 3 TABLETS IN 15 MINUTES. CALL 911 IF PAIN  PERSISTS.   potassium chloride SA (KLOR-CON) 20 MEQ tablet Take 20 mEq by mouth daily.   No facility-administered encounter medications on file as of 12/17/2022.    Allergies (verified)  Tape, Trazodone and nefazodone, and Mesalamine   History: Past Medical History:  Diagnosis Date   Angina pectoris (HCC) 11/22/2019   Arthritis    CAD (coronary artery disease) 12/22/2019   Depression    Depression with anxiety 11/09/2019   DVT (deep venous thrombosis) (HCC)    Essential hypertension 11/22/2019   Generalized edema 11/09/2019   GERD (gastroesophageal reflux disease)    Heart disease    Hyperlipidemia    Hypertension    Insomnia    Lymphocytic colitis    Noninfectious gastroenteritis and colitis 01/12/2013   Pedal edema  11/22/2019   Spinal stenosis, lumbar    Thyroid disease    Weight gain 01/16/2014   Past Surgical History:  Procedure Laterality Date   CHOLECYSTECTOMY     LEFT HEART CATH AND CORONARY ANGIOGRAPHY N/A 11/23/2019   Procedure: LEFT HEART CATH AND CORONARY ANGIOGRAPHY;  Surgeon: Runell Gess, MD;  Location: [facility];  Service: Cardiovascular;  Laterality: N/A;   SPINE SURGERY     2 surgeries    Family History  Problem Relation Age of Onset   Heart attack Mother        42   Diabetes Mother    Other Father        black lung    Heart disease Father    Heart disease Sister        stents    Heart disease Brother    Other Brother        parkinson    COPD Daughter    Diabetes Daughter    Heart disease Daughter    Hypertension Daughter    Hyperlipidemia Daughter    Thyroid disease Daughter    COPD Son    Heart disease Son    Hypertension Son    Hyperlipidemia Son    Arthritis Son        rheum    AAA (abdominal aortic aneurysm) Brother    COPD Daughter    Heart disease Daughter    Hypertension Daughter    Thyroid disease Daughter    Arthritis Daughter        rheum    Heart disease Daughter    Heart attack Daughter    Hyperlipidemia Daughter    Thyroid disease Daughter    Social History   Socioeconomic History   Marital status: Widowed    Spouse name: Not on file   Number of children: 4   Years of education: Not on file   Highest education level: Not on file  Occupational History   Occupation: retired  Tobacco Use   Smoking status: Never   Smokeless tobacco: Never  Vaping Use   Vaping status: Never Used  Substance and Sexual Activity   Alcohol use: No   Drug use: No   Sexual activity: Not on file  Other Topics Concern   Not on file  Social History Narrative   Lives at home - in studio apartment behind daughters house   Has macular degeneration, so she cannot drive - daughter helps her out a lot, but she tries to be independent as much as she can    Social Determinants of Corporate investment banker Strain: Low Risk  (12/17/2022)   Overall Financial Resource Strain (CARDIA)    Difficulty of Paying Living Expenses: Not hard at all  Food Insecurity: No Food Insecurity (12/17/2022)   Hunger Vital Sign    Worried About Running Out of Food in the Last Year: Never  true    Ran Out of Food in the Last Year: Never true  Transportation Needs: No Transportation Needs (12/17/2022)   PRAPARE - Administrator, Civil Service (Medical): No    Lack of Transportation (Non-Medical): No  Physical Activity: Inactive (12/17/2022)   Exercise Vital Sign    Days of Exercise per Week: 0 days    Minutes of Exercise per Session: 0 min  Stress: No Stress Concern Present (12/17/2022)   [employer] of Occupational Health - Occupational Stress Questionnaire    Feeling of Stress : Not at all  Social Connections: Moderately Isolated (12/17/2022)   Social Connection and Isolation Panel [NHANES]    Frequency of Communication with Friends and Family: More than three times a week    Frequency of Social Gatherings with Friends and Family: More than three times a week    Attends Religious Services: 1 to 4 times per year    Active Member of [employer] or Organizations: No    Attends Banker Meetings: Never    Marital Status: Widowed    Tobacco Counseling Counseling given: Not Answered   Clinical Intake:  Pre-visit preparation completed: Yes  Pain : No/denies pain     Nutritional Risks: None Diabetes: No  How often do you need to have someone help you when you read instructions, pamphlets, or other written materials from your doctor or pharmacy?: 1 - Never  Interpreter Needed?: No  Information entered by :: Renie Ora, LPN   Activities of Daily Living    12/17/2022    1:21 PM  In your present state of health, do you have any difficulty performing the following activities:  Hearing? 1  Vision? 1  Difficulty concentrating  or making decisions? 1  Walking or climbing stairs? 1  Dressing or bathing? 1  Doing errands, shopping? 1  Preparing Food and eating ? Y  Using the Toilet? Y  In the past six months, have you accidently leaked urine? Y  Do you have problems with loss of bowel control? Y  Managing your Medications? Y  Managing your Finances? Y  Housekeeping or managing your Housekeeping? Y    Patient Care Team: Raliegh Ip, DO as PCP - General (Family Medicine)  Indicate any recent Medical Services you may have received from other than [facility] providers in the past year (date may be approximate).     Assessment:   This is a routine wellness examination for [place].  Hearing/Vision screen Vision Screening - Comments:: Wears rx glasses - up to date with routine eye exams with  Dr.Sanders  Dietary issues and exercise activities discussed:     Goals Addressed             This Visit's Progress    DIET - INCREASE WATER INTAKE         Depression Screen    12/17/2022    1:20 PM 04/21/2022   10:27 AM 12/15/2021    1:23 PM 11/04/2021    3:19 PM 12/12/2020    3:01 PM 12/11/2020    3:14 PM 04/23/2020    2:53 PM  PHQ 2/9 Scores  PHQ - 2 Score 0 4 4 5 2 4  0  PHQ- 9 Score  10 14 15 8 10  0    Fall Risk    12/17/2022    1:18 PM 04/21/2022   10:27 AM 12/15/2021    1:21 PM 11/04/2021    3:18 PM 12/12/2020    3:03 PM  Fall  Risk   Falls in the past year? 0 0 1 1 0  Number falls in past yr: 0  0 0 0  Injury with Fall? 0  1 1 0  Comment    bruising on face   Risk for fall due to : No Fall Risks  History of fall(s);Impaired balance/gait;Orthopedic patient Impaired balance/gait Orthopedic patient;Impaired vision  Follow up Falls prevention discussed  Education provided;Falls prevention discussed Falls evaluation completed Education provided;Falls prevention discussed    MEDICARE RISK AT HOME:  Medicare Risk at Home - 12/17/22 1318     Any stairs in or around the home? Yes    If so, are  there any without handrails? No    Home free of loose throw rugs in walkways, pet beds, electrical cords, etc? Yes    Adequate lighting in your home to reduce risk of falls? Yes    Life alert? No    Use of a cane, walker or w/c? No    Grab bars in the bathroom? Yes    Shower chair or bench in shower? Yes    Elevated toilet seat or a handicapped toilet? Yes                  12/17/2022    1:22 PM 12/15/2021    1:26 PM 12/12/2020    2:51 PM  6CIT Screen  What Year? 0 points 0 points 0 points  What month? 0 points 0 points 0 points  What time? 0 points 0 points 0 points  Count back from 20 0 points 0 points 0 points  Months in reverse 0 points 0 points 0 points  Repeat phrase 0 points 2 points 0 points  Total Score 0 points 2 points 0 points    Immunizations Immunization History  Administered Date(s) Administered   Fluad Quad(high Dose 65+) 04/21/2022   Influenza,inj,Quad PF,6+ Mos 04/14/2013, 03/15/2014   Influenza-Unspecified 03/25/2020, 03/03/2021   Moderna Sars-Covid-2 Vaccination 06/23/2019, 07/21/2019, 03/25/2020, 10/30/2020   PNEUMOCOCCAL CONJUGATE-20 06/13/2021   Pneumococcal Conjugate-13 03/15/2014   Zoster Recombinant(Shingrix) 06/23/2018, 08/22/2018    TDAP status: Due, Education has been provided regarding the importance of this vaccine. Advised may receive this vaccine at local pharmacy or Health Dept. Aware to provide a copy of the vaccination record if obtained from local pharmacy or Health Dept. Verbalized acceptance and understanding.  Flu Vaccine status: Up to date  Pneumococcal vaccine status: Up to date  Covid-19 vaccine status: Completed vaccines  Qualifies for Shingles Vaccine? Yes   Zostavax completed Yes   Shingrix Completed?: Yes  Screening Tests Health Maintenance  Topic Date Due   DTaP/Tdap/Td (1 - Tdap) Never done   COVID-19 Vaccine (5 - 2023-24 season) 01/23/2022   INFLUENZA VACCINE  12/24/2022   Medicare Annual Wellness (AWV)   12/17/2023   Pneumonia Vaccine 50+ Years old  Completed   Zoster Vaccines- Shingrix  Completed   HPV VACCINES  Aged Out   DEXA SCAN  Discontinued    Health Maintenance  Health Maintenance Due  Topic Date Due   DTaP/Tdap/Td (1 - Tdap) Never done   COVID-19 Vaccine (5 - 2023-24 season) 01/23/2022    Colorectal cancer screening: No longer required.   Mammogram status: No longer required due to age.  Bone Density status: Ordered declined . Pt provided with contact info and advised to call to schedule appt.  Lung Cancer Screening: (Low Dose CT Chest recommended if Age 6-80 years, 20 pack-year currently smoking OR have quit w/in 15years.)  does not qualify.   Lung Cancer Screening Referral: n/a  Additional Screening:  Hepatitis C Screening: does not qualify;   Vision Screening: Recommended annual ophthalmology exams for early detection of glaucoma and other disorders of the eye. Is the patient up to date with their annual eye exam?  Yes  Who is the provider or what is the name of the office in which the patient attends annual eye exams? Dr.Sanders  If pt is not established with a provider, would they like to be referred to a provider to establish care? No .   Dental Screening: Recommended annual dental exams for proper oral hygiene    Community Resource Referral / Chronic Care Management: CRR required this visit?  No   CCM required this visit?  No     Plan:     I have personally reviewed and noted the following in the patient's chart:   Medical and social history Use of alcohol, tobacco or illicit drugs  Current medications and supplements including opioid prescriptions. Patient is not currently taking opioid prescriptions. Functional ability and status Nutritional status Physical activity Advanced directives List of other physicians Hospitalizations, surgeries, and ER visits in previous 12 months Vitals Screenings to include cognitive, depression, and  falls Referrals and appointments  In addition, I have reviewed and discussed with patient certain preventive protocols, quality metrics, and best practice recommendations. A written personalized care plan for preventive services as well as general preventive health recommendations were provided to patient.     Lorrene Reid, LPN   6/96/2952   After Visit Summary: (MyChart) Due to this being a telephonic visit, the after visit summary with patients personalized plan was offered to patient via MyChart   Nurse Notes: none

## 2022-12-25 ENCOUNTER — Other Ambulatory Visit: Payer: Self-pay | Admitting: Family Medicine

## 2022-12-25 DIAGNOSIS — R197 Diarrhea, unspecified: Secondary | ICD-10-CM

## 2022-12-25 MED ORDER — LOPERAMIDE HCL 2 MG PO CAPS: 2.0000 mg | ORAL_CAPSULE | Freq: Four times a day (QID) | ORAL | 0 refills | Status: DC | PRN

## 2022-12-25 NOTE — Telephone Encounter (Signed)
  Prescription Request  12/25/2022  Is this a "Controlled Substance" medicine? loperamide (IMODIUM) 2 MG capsule   Have you seen your PCP in the last 2 weeks? 01/18/2023  If YES, route message to pool  -  If NO, patient needs to be scheduled for appointment.  What is the name of the medication or equipment? loperamide (IMODIUM) 2 MG capsule   Have you contacted your pharmacy to request a refill? No new rx   Which pharmacy would you like this sent to? [facility]   Patient notified that their request is being sent to the clinical staff for review and that they should receive a response within 2 business days.

## 2022-12-25 NOTE — Telephone Encounter (Signed)
Aware refill sent to pharmacy ?

## 2022-12-31 ENCOUNTER — Other Ambulatory Visit: Payer: Self-pay | Admitting: Family Medicine

## 2022-12-31 DIAGNOSIS — R197 Diarrhea, unspecified: Secondary | ICD-10-CM

## 2023-01-01 NOTE — Telephone Encounter (Signed)
Is she using this 4 times per day still?

## 2023-01-18 ENCOUNTER — Ambulatory Visit (INDEPENDENT_AMBULATORY_CARE_PROVIDER_SITE_OTHER): Payer: Medicare Other | Admitting: Family Medicine

## 2023-01-18 ENCOUNTER — Encounter: Payer: Self-pay | Admitting: Family Medicine

## 2023-01-18 VITALS — BP 136/78 | HR 64 | Temp 98.7°F | Ht 62.0 in | Wt 187.0 lb

## 2023-01-18 DIAGNOSIS — I10 Essential (primary) hypertension: Secondary | ICD-10-CM | POA: Diagnosis not present

## 2023-01-18 DIAGNOSIS — H5410 Blindness, one eye, low vision other eye, unspecified eyes: Secondary | ICD-10-CM

## 2023-01-18 DIAGNOSIS — E039 Hypothyroidism, unspecified: Secondary | ICD-10-CM

## 2023-01-18 DIAGNOSIS — F418 Other specified anxiety disorders: Secondary | ICD-10-CM

## 2023-01-18 DIAGNOSIS — E538 Deficiency of other specified B group vitamins: Secondary | ICD-10-CM

## 2023-01-18 DIAGNOSIS — R739 Hyperglycemia, unspecified: Secondary | ICD-10-CM

## 2023-01-18 DIAGNOSIS — N1831 Chronic kidney disease, stage 3a: Secondary | ICD-10-CM | POA: Insufficient documentation

## 2023-01-18 DIAGNOSIS — I2510 Atherosclerotic heart disease of native coronary artery without angina pectoris: Secondary | ICD-10-CM

## 2023-01-18 LAB — BAYER DCA HB A1C WAIVED: HB A1C (BAYER DCA - WAIVED): 5.2 % (ref 4.8–5.6)

## 2023-01-18 MED ORDER — MIRTAZAPINE 7.5 MG PO TABS
7.5000 mg | ORAL_TABLET | Freq: Every day | ORAL | 3 refills | Status: AC
Start: 2023-01-18 — End: ?

## 2023-01-18 NOTE — Patient Instructions (Signed)
STOP Lexapro when you start the Mirtazapine (take at bedtime) I will place referral for palliative for goals of care discussion Again, we have a counselor that is available to you

## 2023-01-18 NOTE — Progress Notes (Signed)
Subjective: CC: Chronic follow-up PCP: Raliegh Ip, DO [id number] Vanwinkle is a 87 y.o. female presenting to clinic today for:  1.  Hypothyroidism/ depression She reports intermittent compliance with all medications.  She reports depressive symptoms but describes these as "giving up".  She is "tired and feels like she is a burden to her daughter".  She currently resides in a small cottage behind her daughter's home.  She is totally dependent on her daughter for transportation due to blindness in her right eye and difficulty seeing out of the left eye.  She reports were more surrounding loss of independence.  No SI or HI but does feel like she'd be better off passing on.  She is currently with Lexapro.  Appetite is fair.  Sleep is fair if she utilizes OTC sleep aid she reports isolating behaviors.  Utilizes a machine to read books to her but does report sadness about not being able to read them herself  2.  Hypertension Patient is currently taking her lisinopril and lasix but utilization of this varies.  3.  Skin lesion Patient reports a skin lesion along the left lower lid of her eye that she would like to have removed.  She does see an eye doctor every 2 months and has not discussed this with them.  ROS: Per HPI  Allergies  Allergen Reactions   Tape Other (See Comments)    SKIN IS VERY THIN AND TEARS/BLEEDS VERY EASILY!!!!   Trazodone And Nefazodone Nausea And Vomiting   Mesalamine Rash   Past Medical History:  Diagnosis Date   Angina pectoris (HCC) 11/22/2019   Arthritis    CAD (coronary artery disease) 12/22/2019   Depression    Depression with anxiety 11/09/2019   DVT (deep venous thrombosis) (HCC)    Essential hypertension 11/22/2019   Generalized edema 11/09/2019   GERD (gastroesophageal reflux disease)    Heart disease    Hyperlipidemia    Hypertension    Insomnia    Lymphocytic colitis    Noninfectious gastroenteritis and colitis 01/12/2013   Pedal edema  11/22/2019   Spinal stenosis, lumbar    Thyroid disease    Weight gain 01/16/2014    Current Outpatient Medications:    acetaminophen (TYLENOL) 650 MG CR tablet, Take 1,300 mg by mouth in the morning., Disp: , Rfl:    aspirin EC 81 MG tablet, Take 1 tablet (81 mg total) by mouth daily. Swallow whole., Disp: 90 tablet, Rfl: 3   Cholecalciferol (VITAMIN D3) 20 MCG (800 UNIT) TABS, Take 1 tablet by mouth daily. Except for Saturday and Sunday, Disp: , Rfl:    escitalopram (LEXAPRO) 10 MG tablet, Take 1 tablet (10 mg total) by mouth daily., Disp: 90 tablet, Rfl: 3   furosemide (LASIX) 40 MG tablet, Take 2 tablets (80 mg total) by mouth daily., Disp: 180 tablet, Rfl: 3   gabapentin (NEURONTIN) 100 MG capsule, Take 1-3 capsules (100-300 mg total) by mouth at bedtime as needed (pain)., Disp: 270 capsule, Rfl: 3   levothyroxine (SYNTHROID) 25 MCG tablet, Take 1 tablet (25 mcg total) by mouth daily before breakfast., Disp: 90 tablet, Rfl: 3   lisinopril (ZESTRIL) 5 MG tablet, Take 1 tablet (5 mg total) by mouth daily., Disp: 90 tablet, Rfl: 3   loperamide (IMODIUM) 2 MG capsule, TAKE 1 CAPSULE BY MOUTH EVERY 6  HOURS AS NEEDED FOR DIARRHEA, Disp: 60 capsule, Rfl: PRN   Multiple Vitamins-Minerals (PRESERVISION AREDS PO), Take 1 capsule by mouth daily. , Disp: ,  Rfl:    nitroGLYCERIN (NITROSTAT) 0.4 MG SL tablet, DISSOLVE 1 TABLET UNDER THE  TONGUE EVERY 5 MINUTES AS NEEDED FOR CHEST PAIN. MAX OF 3 TABLETS IN 15 MINUTES. CALL 911 IF PAIN  PERSISTS., Disp: 100 tablet, Rfl: 3   potassium chloride SA (KLOR-CON) 20 MEQ tablet, Take 20 mEq by mouth daily., Disp: , Rfl:  Social History   Socioeconomic History   Marital status: Widowed    Spouse name: Not on file   Number of children: 4   Years of education: Not on file   Highest education level: Not on file  Occupational History   Occupation: retired  Tobacco Use   Smoking status: Never   Smokeless tobacco: Never  Vaping Use   Vaping status: Never Used   Substance and Sexual Activity   Alcohol use: No   Drug use: No   Sexual activity: Not on file  Other Topics Concern   Not on file  Social History Narrative   Lives at home - in studio apartment behind daughters house   Has macular degeneration, so she cannot drive - daughter helps her out a lot, but she tries to be independent as much as she can   Social Determinants of Corporate investment banker Strain: Low Risk  (12/17/2022)   Overall Financial Resource Strain (CARDIA)    Difficulty of Paying Living Expenses: Not hard at all  Food Insecurity: No Food Insecurity (12/17/2022)   Hunger Vital Sign    Worried About Running Out of Food in the Last Year: Never true    Ran Out of Food in the Last Year: Never true  Transportation Needs: No Transportation Needs (12/17/2022)   PRAPARE - Administrator, Civil Service (Medical): No    Lack of Transportation (Non-Medical): No  Physical Activity: Inactive (12/17/2022)   Exercise Vital Sign    Days of Exercise per Week: 0 days    Minutes of Exercise per Session: 0 min  Stress: No Stress Concern Present (12/17/2022)   [employer] of Occupational Health - Occupational Stress Questionnaire    Feeling of Stress : Not at all  Social Connections: Moderately Isolated (12/17/2022)   Social Connection and Isolation Panel [NHANES]    Frequency of Communication with Friends and Family: More than three times a week    Frequency of Social Gatherings with Friends and Family: More than three times a week    Attends Religious Services: 1 to 4 times per year    Active Member of [employer] or Organizations: No    Attends Banker Meetings: Never    Marital Status: Widowed  Intimate Partner Violence: Not At Risk (12/17/2022)   Humiliation, Afraid, Rape, and Kick questionnaire    Fear of Current or Ex-Partner: No    Emotionally Abused: No    Physically Abused: No    Sexually Abused: No   Family History  Problem Relation Age of Onset    Heart attack Mother        20   Diabetes Mother    Other Father        black lung    Heart disease Father    Heart disease Sister        stents    Heart disease Brother    Other Brother        parkinson    COPD Daughter    Diabetes Daughter    Heart disease Daughter    Hypertension Daughter    Hyperlipidemia  Daughter    Thyroid disease Daughter    COPD Son    Heart disease Son    Hypertension Son    Hyperlipidemia Son    Arthritis Son        rheum    AAA (abdominal aortic aneurysm) Brother    COPD Daughter    Heart disease Daughter    Hypertension Daughter    Thyroid disease Daughter    Arthritis Daughter        rheum    Heart disease Daughter    Heart attack Daughter    Hyperlipidemia Daughter    Thyroid disease Daughter     Objective: Office vital signs reviewed. BP 136/78   Pulse 64   Temp 98.7 F (37.1 C)   Ht 5\' 2"  (1.575 m)   Wt 187 lb (84.8 kg)   SpO2 97%   BMI 34.20 kg/m   Physical Examination:  General: Awake, alert, well nourished, No acute distress HEENT: sclera white, MMM Cardio: regular rate and rhythm, S1S2 heard, no murmurs appreciated Pulm: clear to auscultation bilaterally, no wheezes, rhonchi or rales; normal work of breathing on room air MSK: ambulates with some assistance Psych: tearful     01/18/2023    8:25 AM 12/17/2022    1:20 PM 04/21/2022   10:27 AM  Depression screen PHQ 2/9  Decreased Interest 3 0 3  Down, Depressed, Hopeless 3 0 1  PHQ - 2 Score 6 0 4  Altered sleeping 3  1  Tired, decreased energy 3  3  Change in appetite 2  0  Feeling bad or failure about yourself  0  0  Trouble concentrating 0  0  Moving slowly or fidgety/restless 2  0  Suicidal thoughts 2  2  PHQ-9 Score 18  10  Difficult doing work/chores Somewhat difficult  Not difficult at all      01/18/2023    8:25 AM 04/21/2022   10:27 AM 11/04/2021    3:19 PM 12/11/2020    3:14 PM  GAD 7 : Generalized Anxiety Score  Nervous, Anxious, on Edge 0 0 1  0  Control/stop worrying 0 0 0 2  Worry too much - different things 0 0 0 2  Trouble relaxing 0 0 0 0  Restless 0 0 0 0  Easily annoyed or irritable 0 0 0 0  Afraid - awful might happen 0 0 0 0  Total GAD 7 Score 0 0 1 4  Anxiety Difficulty Not difficult at all Not difficult at all Not difficult at all Not difficult at all     Assessment/ Plan: 87 y.o. female   Acquired hypothyroidism - Plan: TSH, T4, Free  Essential hypertension - Plan: CMP14+EGFR  Vitamin B12 deficiency - Plan: Vitamin B12  Depression with anxiety - Plan: mirtazapine (REMERON) 7.5 MG tablet, Amb Referral to Palliative Care  Stage 3a chronic kidney disease (HCC) - Plan: CMP14+EGFR, CBC, VITAMIN D 25 Hydroxy (Vit-D Deficiency, Fractures), Amb Referral to Palliative Care  Coronary artery disease involving native coronary artery of native heart without angina pectoris - Plan: Lipid Panel, Amb Referral to Palliative Care  Elevated serum glucose - Plan: Bayer DCA Hb A1c Waived  Blindness and low vision - Plan: Amb Referral to Palliative Care  Check thyroid levels.  Perhaps this is impacting her depression  Blood pressure well-controlled.  No changes  Check B12 level.  I am replacing her Lexapro with mirtazapine.  We can consider adding low-dose Lexapro back if needed at some  point but would like to err on the side of caution and not precipitate QTc prolongation and/or serotonin syndrome with concomitant use of these medications.  Would like to see her back in the next couple of months and we will plan to advance accordingly.  I did offer her integrated behavioral health specialist services but she declined these today  Check renal function.  Referral to palliative care for goals of care and just closer watching of this patient in the outpatient setting placed    Medications Discontinued During This Encounter  Medication Reason   escitalopram (LEXAPRO) 10 MG tablet       Raliegh Ip, DO [facility] [phone]

## 2023-01-19 LAB — CBC
Hematocrit: 42.9 % (ref 34.0–46.6)
Hemoglobin: 14.3 g/dL (ref 11.1–15.9)
MCH: 32.1 pg (ref 26.6–33.0)
MCHC: 33.3 g/dL (ref 31.5–35.7)
MCV: 96 fL (ref 79–97)
Platelets: 224 10*3/uL (ref 150–450)
RBC: 4.45 x10E6/uL (ref 3.77–5.28)
RDW: 13 % (ref 11.7–15.4)
WBC: 7.5 10*3/uL (ref 3.4–10.8)

## 2023-01-19 LAB — LIPID PANEL
Chol/HDL Ratio: 3.3 ratio (ref 0.0–4.4)
Cholesterol, Total: 173 mg/dL (ref 100–199)
HDL: 53 mg/dL (ref >39)
LDL Chol Calc (NIH): 103 mg/dL — ABNORMAL HIGH (ref 0–99)
Triglycerides: 91 mg/dL (ref 0–149)
VLDL Cholesterol Cal: 17 mg/dL (ref 5–40)

## 2023-01-19 LAB — CMP14+EGFR
ALT: 9 IU/L (ref 0–32)
AST: 14 IU/L (ref 0–40)
Albumin: 4 g/dL (ref 3.7–4.7)
Alkaline Phosphatase: 101 IU/L (ref 44–121)
BUN/Creatinine Ratio: 20 (ref 12–28)
BUN: 22 mg/dL (ref 8–27)
Bilirubin Total: 0.7 mg/dL (ref 0.0–1.2)
CO2: 24 mmol/L (ref 20–29)
Calcium: 9.4 mg/dL (ref 8.7–10.3)
Chloride: 101 mmol/L (ref 96–106)
Creatinine, Ser: 1.1 mg/dL — ABNORMAL HIGH (ref 0.57–1.00)
Globulin, Total: 2.2 g/dL (ref 1.5–4.5)
Glucose: 91 mg/dL (ref 70–99)
Potassium: 4.4 mmol/L (ref 3.5–5.2)
Sodium: 139 mmol/L (ref 134–144)
Total Protein: 6.2 g/dL (ref 6.0–8.5)
eGFR: 50 mL/min/{1.73_m2} — ABNORMAL LOW (ref >59)

## 2023-01-19 LAB — T4, FREE: Free T4: 1.69 ng/dL (ref 0.82–1.77)

## 2023-01-19 LAB — TSH: TSH: 7.32 u[IU]/mL — ABNORMAL HIGH (ref 0.450–4.500)

## 2023-01-19 LAB — VITAMIN B12: Vitamin B-12: 515 pg/mL (ref 232–1245)

## 2023-01-19 LAB — VITAMIN D 25 HYDROXY (VIT D DEFICIENCY, FRACTURES): Vit D, 25-Hydroxy: 21.9 ng/mL — ABNORMAL LOW (ref 30.0–100.0)

## 2023-01-20 ENCOUNTER — Other Ambulatory Visit: Payer: Self-pay

## 2023-01-20 DIAGNOSIS — R7989 Other specified abnormal findings of blood chemistry: Secondary | ICD-10-CM

## 2023-02-23 ENCOUNTER — Other Ambulatory Visit: Payer: Self-pay | Admitting: Family Medicine

## 2023-02-23 DIAGNOSIS — E039 Hypothyroidism, unspecified: Secondary | ICD-10-CM

## 2023-02-23 DIAGNOSIS — F418 Other specified anxiety disorders: Secondary | ICD-10-CM

## 2023-02-23 DIAGNOSIS — I10 Essential (primary) hypertension: Secondary | ICD-10-CM

## 2023-03-02 ENCOUNTER — Other Ambulatory Visit: Payer: Medicare Other

## 2023-03-02 DIAGNOSIS — R7989 Other specified abnormal findings of blood chemistry: Secondary | ICD-10-CM

## 2023-03-03 ENCOUNTER — Telehealth: Payer: Self-pay | Admitting: Family Medicine

## 2023-03-03 LAB — THYROID PANEL WITH TSH
Free Thyroxine Index: 2.7 (ref 1.2–4.9)
T3 Uptake Ratio: 30 % (ref 24–39)
T4, Total: 9 ug/dL (ref 4.5–12.0)
TSH: 5.42 u[IU]/mL — ABNORMAL HIGH (ref 0.450–4.500)

## 2023-03-03 NOTE — Telephone Encounter (Signed)
Caryn Bee called from [facility] to get PCP approval on switching manufacturer for pts Levothyroxine Rx.  Ref# [id number]

## 2023-03-03 NOTE — Telephone Encounter (Signed)
Of course. Please make sure she either has an appt with me soon to recheck levels.  If not please place orders for TSH/ Ft4 to be collected in 2 months after starting new manufacturer.

## 2023-03-04 ENCOUNTER — Other Ambulatory Visit: Payer: Self-pay

## 2023-03-04 DIAGNOSIS — E039 Hypothyroidism, unspecified: Secondary | ICD-10-CM

## 2023-03-04 NOTE — Telephone Encounter (Signed)
[facility] made aware.

## 2023-03-24 ENCOUNTER — Ambulatory Visit: Payer: Medicare Other

## 2023-04-15 ENCOUNTER — Other Ambulatory Visit: Payer: Medicare Other

## 2023-04-27 ENCOUNTER — Other Ambulatory Visit: Payer: Self-pay | Admitting: Family Medicine

## 2023-04-27 DIAGNOSIS — E039 Hypothyroidism, unspecified: Secondary | ICD-10-CM

## 2023-04-27 DIAGNOSIS — I10 Essential (primary) hypertension: Secondary | ICD-10-CM

## 2023-04-28 MED ORDER — LEVOTHYROXINE SODIUM 25 MCG PO TABS
25.0000 ug | ORAL_TABLET | Freq: Every day | ORAL | 0 refills | Status: AC
Start: 2023-04-28 — End: ?

## 2023-04-28 MED ORDER — FUROSEMIDE 40 MG PO TABS: 80.0000 mg | ORAL_TABLET | Freq: Every day | ORAL | 0 refills | Status: AC

## 2023-04-28 MED ORDER — GABAPENTIN 100 MG PO CAPS: ORAL_CAPSULE | ORAL | 0 refills | Status: AC

## 2023-04-28 MED ORDER — LISINOPRIL 5 MG PO TABS: 5.0000 mg | ORAL_TABLET | Freq: Every day | ORAL | 0 refills | Status: AC

## 2023-04-28 NOTE — Addendum Note (Signed)
Addended by: Julious Payer D on: 04/28/2023 08:39 AM   Modules accepted: Orders

## 2023-04-28 NOTE — Telephone Encounter (Signed)
Appt made for 07/07/2023, please send refill to pharmacy

## 2023-04-28 NOTE — Telephone Encounter (Signed)
[facility] NTBS for 3 mos FU NO RF sent to mail order pharmacy

## 2023-07-07 ENCOUNTER — Ambulatory Visit: Payer: Medicare Other | Admitting: Family Medicine

## 2023-07-10 ENCOUNTER — Other Ambulatory Visit: Payer: Self-pay | Admitting: Family Medicine

## 2023-07-10 DIAGNOSIS — I10 Essential (primary) hypertension: Secondary | ICD-10-CM

## 2023-07-10 DIAGNOSIS — E039 Hypothyroidism, unspecified: Secondary | ICD-10-CM

## 2023-11-14 ENCOUNTER — Other Ambulatory Visit: Payer: Self-pay | Admitting: Family Medicine

## 2024-01-16 ENCOUNTER — Other Ambulatory Visit: Payer: Self-pay | Admitting: Family Medicine
